# Patient Record
Sex: Female | Born: 1970 | Race: White | Hispanic: No | Marital: Married | Smoking: Never smoker
Health system: Southern US, Community
[De-identification: ages and names within clinical notes are randomized; demographics above are authoritative.]

## PROBLEM LIST (undated history)

## (undated) DIAGNOSIS — E785 Hyperlipidemia, unspecified: Secondary | ICD-10-CM

## (undated) HISTORY — DX: Hyperlipidemia, unspecified: E78.5

## (undated) HISTORY — PX: WISDOM TOOTH EXTRACTION: SHX21

---

## 1998-03-16 ENCOUNTER — Other Ambulatory Visit: Admission: RE | Admit: 1998-03-16 | Discharge: 1998-03-16 | Payer: Self-pay | Admitting: Gynecology

## 1999-03-21 ENCOUNTER — Other Ambulatory Visit: Admission: RE | Admit: 1999-03-21 | Discharge: 1999-03-21 | Payer: Self-pay | Admitting: Gynecology

## 2000-03-27 ENCOUNTER — Other Ambulatory Visit: Admission: RE | Admit: 2000-03-27 | Discharge: 2000-03-27 | Payer: Self-pay | Admitting: Gynecology

## 2000-09-14 ENCOUNTER — Ambulatory Visit (HOSPITAL_COMMUNITY): Admission: RE | Admit: 2000-09-14 | Discharge: 2000-09-14 | Payer: Self-pay | Admitting: Obstetrics and Gynecology

## 2000-09-14 ENCOUNTER — Encounter: Payer: Self-pay | Admitting: Obstetrics and Gynecology

## 2000-10-12 ENCOUNTER — Encounter: Payer: Self-pay | Admitting: Obstetrics and Gynecology

## 2000-10-12 ENCOUNTER — Ambulatory Visit (HOSPITAL_COMMUNITY): Admission: RE | Admit: 2000-10-12 | Discharge: 2000-10-12 | Payer: Self-pay | Admitting: Obstetrics and Gynecology

## 2000-11-09 ENCOUNTER — Encounter: Payer: Self-pay | Admitting: Obstetrics and Gynecology

## 2000-11-09 ENCOUNTER — Ambulatory Visit (HOSPITAL_COMMUNITY): Admission: RE | Admit: 2000-11-09 | Discharge: 2000-11-09 | Payer: Self-pay | Admitting: Obstetrics and Gynecology

## 2000-12-07 ENCOUNTER — Encounter: Payer: Self-pay | Admitting: Obstetrics and Gynecology

## 2000-12-07 ENCOUNTER — Ambulatory Visit (HOSPITAL_COMMUNITY): Admission: RE | Admit: 2000-12-07 | Discharge: 2000-12-07 | Payer: Self-pay | Admitting: Obstetrics and Gynecology

## 2001-01-04 ENCOUNTER — Encounter: Payer: Self-pay | Admitting: Obstetrics and Gynecology

## 2001-01-04 ENCOUNTER — Ambulatory Visit (HOSPITAL_COMMUNITY): Admission: RE | Admit: 2001-01-04 | Discharge: 2001-01-04 | Payer: Self-pay | Admitting: Obstetrics and Gynecology

## 2001-01-24 ENCOUNTER — Inpatient Hospital Stay (HOSPITAL_COMMUNITY): Admission: AD | Admit: 2001-01-24 | Discharge: 2001-01-27 | Payer: Self-pay | Admitting: Obstetrics and Gynecology

## 2001-01-28 ENCOUNTER — Encounter: Admission: RE | Admit: 2001-01-28 | Discharge: 2001-02-27 | Payer: Self-pay | Admitting: Obstetrics and Gynecology

## 2001-02-28 ENCOUNTER — Encounter: Admission: RE | Admit: 2001-02-28 | Discharge: 2001-03-30 | Payer: Self-pay | Admitting: Obstetrics and Gynecology

## 2001-03-01 ENCOUNTER — Other Ambulatory Visit: Admission: RE | Admit: 2001-03-01 | Discharge: 2001-03-01 | Payer: Self-pay | Admitting: Obstetrics and Gynecology

## 2002-03-21 ENCOUNTER — Other Ambulatory Visit: Admission: RE | Admit: 2002-03-21 | Discharge: 2002-03-21 | Payer: Self-pay | Admitting: Obstetrics and Gynecology

## 2003-03-30 ENCOUNTER — Ambulatory Visit (HOSPITAL_COMMUNITY): Admission: RE | Admit: 2003-03-30 | Discharge: 2003-03-30 | Payer: Self-pay | Admitting: Orthopedic Surgery

## 2004-02-22 ENCOUNTER — Inpatient Hospital Stay (HOSPITAL_COMMUNITY): Admission: AD | Admit: 2004-02-22 | Discharge: 2004-02-24 | Payer: Self-pay | Admitting: Obstetrics and Gynecology

## 2004-02-25 ENCOUNTER — Encounter: Admission: RE | Admit: 2004-02-25 | Discharge: 2004-03-26 | Payer: Self-pay | Admitting: Obstetrics and Gynecology

## 2004-04-04 ENCOUNTER — Other Ambulatory Visit: Admission: RE | Admit: 2004-04-04 | Discharge: 2004-04-04 | Payer: Self-pay | Admitting: Obstetrics and Gynecology

## 2004-04-24 ENCOUNTER — Encounter: Admission: RE | Admit: 2004-04-24 | Discharge: 2004-05-24 | Payer: Self-pay | Admitting: Obstetrics and Gynecology

## 2006-09-27 ENCOUNTER — Ambulatory Visit (HOSPITAL_COMMUNITY): Admission: RE | Admit: 2006-09-27 | Discharge: 2006-09-27 | Payer: Self-pay | Admitting: Obstetrics and Gynecology

## 2006-10-24 ENCOUNTER — Ambulatory Visit (HOSPITAL_COMMUNITY): Admission: RE | Admit: 2006-10-24 | Discharge: 2006-10-24 | Payer: Self-pay | Admitting: Obstetrics and Gynecology

## 2007-03-24 ENCOUNTER — Inpatient Hospital Stay (HOSPITAL_COMMUNITY): Admission: AD | Admit: 2007-03-24 | Discharge: 2007-03-26 | Payer: Self-pay | Admitting: Obstetrics and Gynecology

## 2007-03-28 ENCOUNTER — Encounter: Admission: RE | Admit: 2007-03-28 | Discharge: 2007-04-27 | Payer: Self-pay | Admitting: Obstetrics and Gynecology

## 2007-04-28 ENCOUNTER — Encounter: Admission: RE | Admit: 2007-04-28 | Discharge: 2007-05-21 | Payer: Self-pay | Admitting: Obstetrics and Gynecology

## 2008-01-19 IMAGING — US US OB NUCHAL TRANSLUCENCY 1ST GEST
1 series · 14 of 28 positions shown · non-contrast
Comparison: none

OBSTETRICAL ULTRASOUND:
 This ultrasound was performed in The [HOSPITAL], and the [facility] report will be stored to [REDACTED] PACS.

[Series 1: us ob nuchal translucency 1st gest · 14 of 33 slices shown]
[im 2/33]
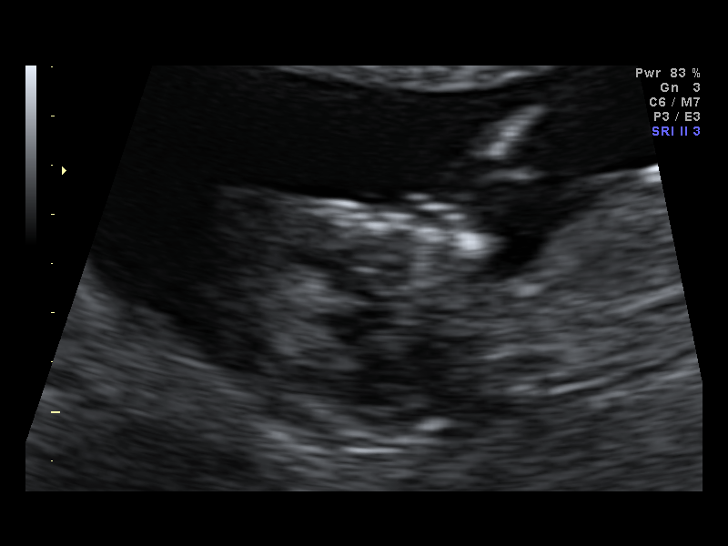
[im 4/33]
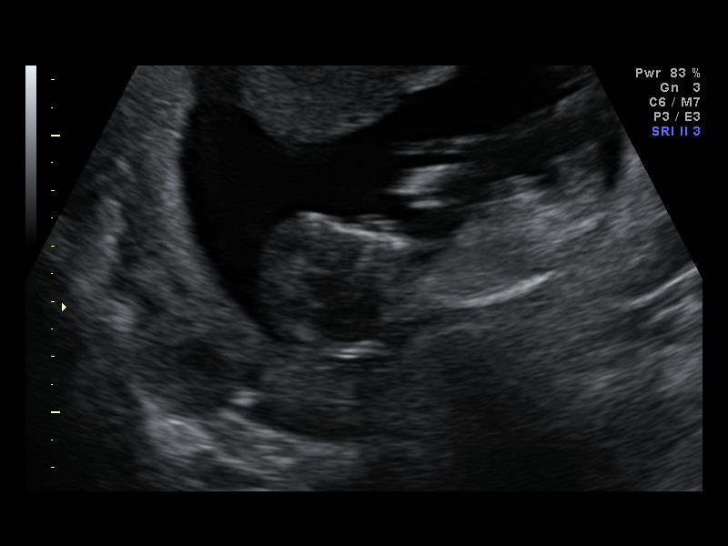
[im 6/33]
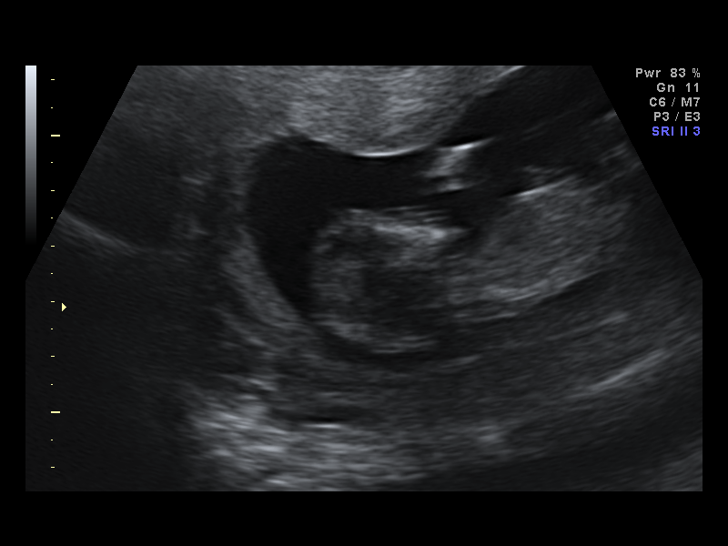
[im 9/33]
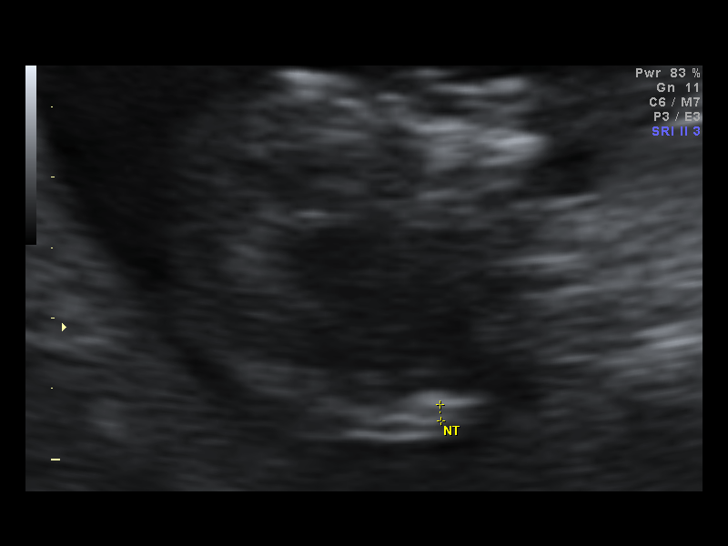
[im 11/33]
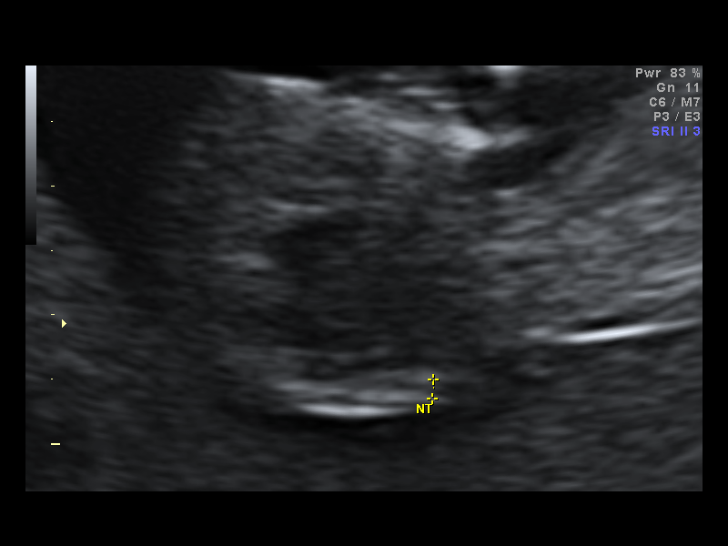
[im 14/33]
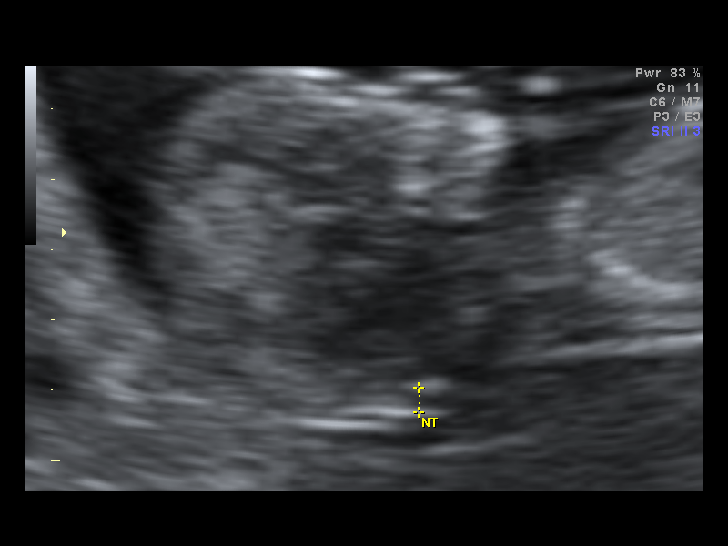
[im 16/33]
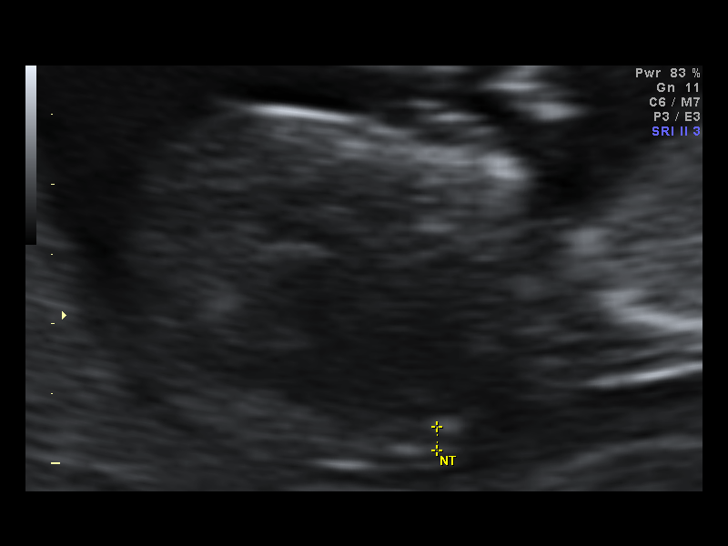
[im 18/33]
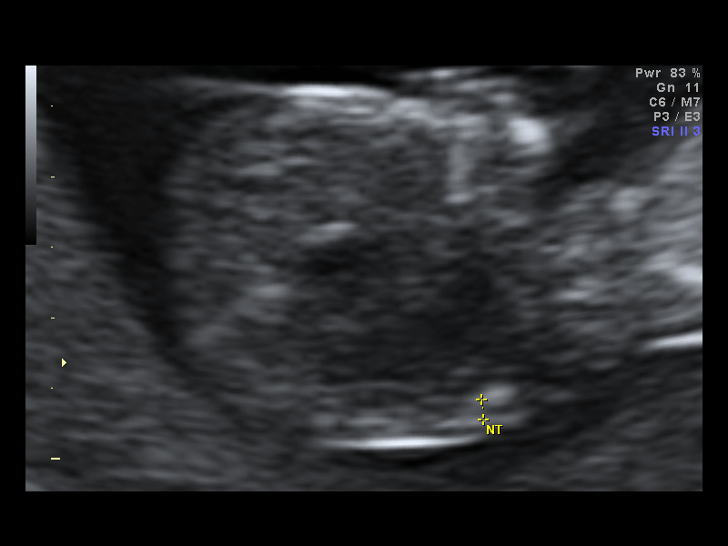
[im 21/33]
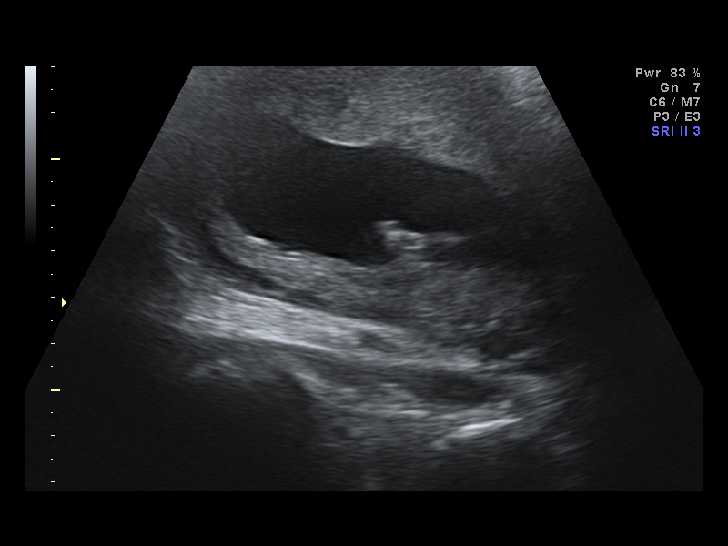
[im 23/33]
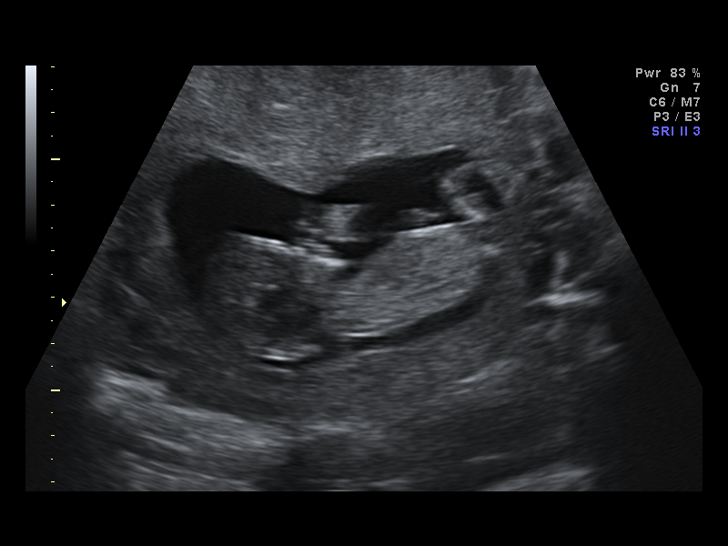
[im 25/33]
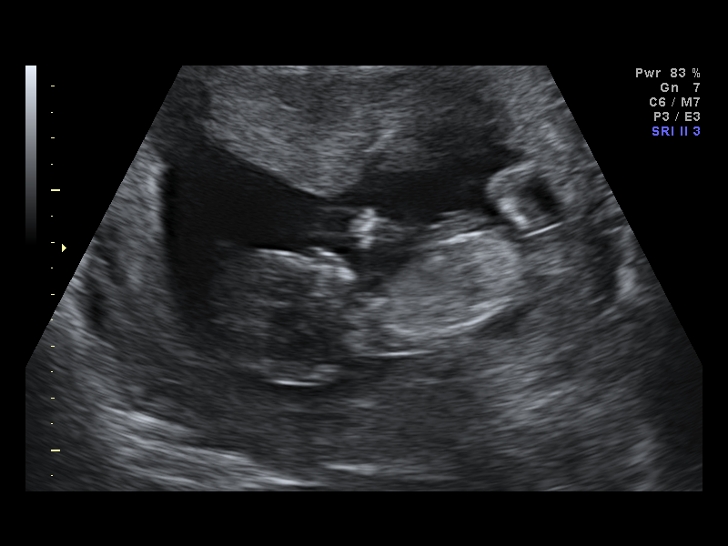
[im 28/33]
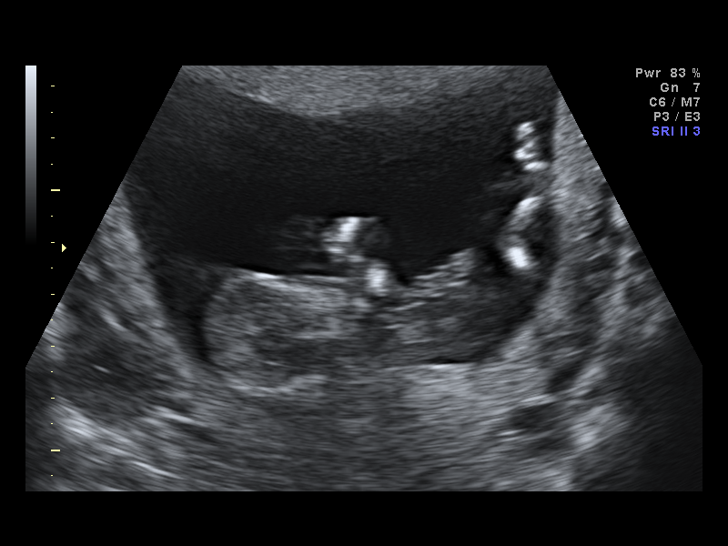
[im 30/33]
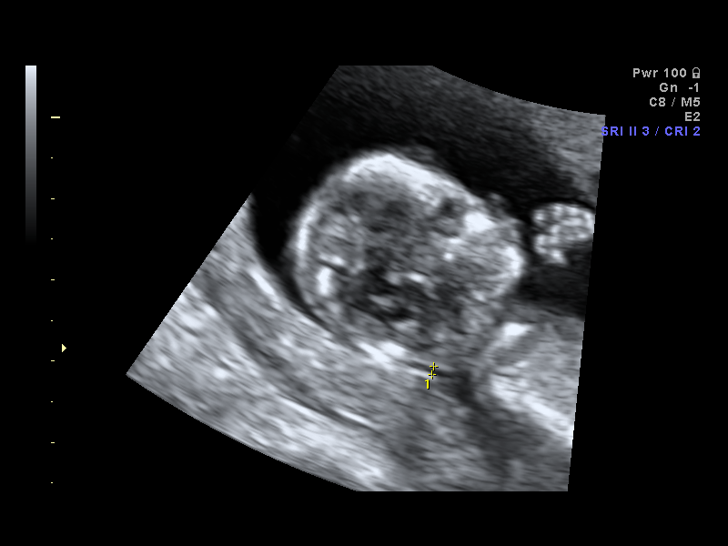
[im 33/33]
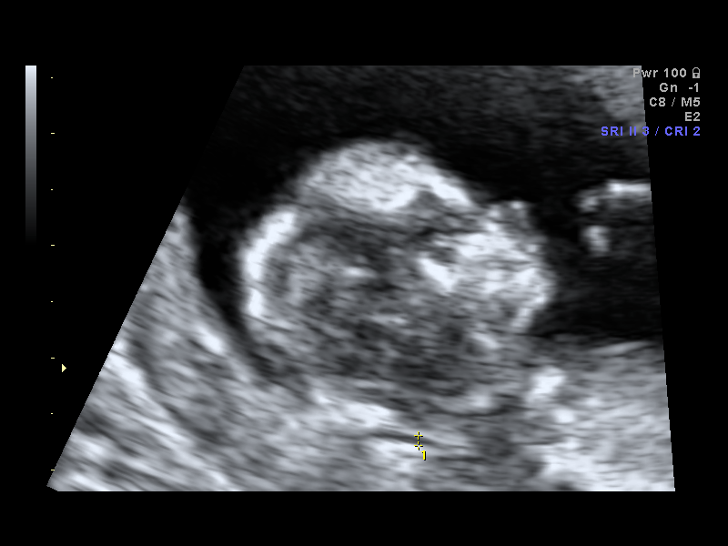

[14 of 28 positions shown; findings below may reference images not displayed]

IMPRESSION: The [facility] report has also been faxed to the ordering physician.

## 2010-05-24 NOTE — Discharge Summary (Signed)
Alexandra Flynn, Alexandra Flynn           ACCOUNT NO.:  [id number]   MEDICAL RECORD NO.:  [id number]          PATIENT TYPE:  INP   LOCATION:  9125                          FACILITY:  [facility]   PHYSICIAN:  Huel Cote, M.D. DATE OF BIRTH:  [birth year]   DATE OF ADMISSION:  03/24/2007  DATE OF DISCHARGE:  03/26/2007                               DISCHARGE SUMMARY   DISCHARGE DIAGNOSES:  1. Term pregnancy at 38 weeks delivery.  2. Status post precipitous vaginal  delivery.   DISCHARGE MEDICATIONS:  1. Motrin 600 mg p.o.q.6 h.  2. Percocet 1-2 tablet p.o. q.4 h. p.r.n.   DISCHARGE FOLLOWUP:  The patient is to followup in the office in 6 weeks  for her routine postpartum exam.   HOSPITAL COURSE:  The patient is a 40 year old G3, P 2-0-0-2 who was  admitted at [redacted] weeks gestation with complaint of ruptured membranes.  She was placed on Pitocin and augmented due to slow contractions.  Her  prenatal care was complicated only by advanced maternal age of normal  integrated screen.   Prenatal labs were as follows, A+ positive, antibody negative, RPR  nonreactive, rubella immune, hepatitis B surface antigen negative, HIV  negative, GC and Chlamydia negative,  group B strep negative, 1-hour  glucola 79, first trimester screen normal.   PAST OB HISTORY:  She had had 2 vaginal deliveries prior in 2003 and  2006.  She had no significant gynecological, surgical, or medical  history.   On admission, she was afebrile with stable vital signs.  Fetal heart  rate was reactive.  Cervix was 74 and -1 station.  She did get  uncomfortable and received her epidural within 10 minutes of checking  her and calling her 4 cm.  I was called back that the patient was ready  to push.  She progressed precipitously over 15 minutes from 4 cm to  complete and pushed well with vigorous female infant delivered ;  however, a small first degree laceration.  Weight was 6 pounds 4 ounces.  Apgars were 8 and 9.  First degree  laceration was repaired with 2-0  Vicryl for hemostasis under a local block.  Placenta was retained after  25 minutes, though she was given Versed 2 mg and fentanyl 2 mg, and the  placenta was delivered by the vigorous fundal massage.  Estimated blood  loss was 350 mL.  The cervix and the rectum were intact.  By postpartum  day #2, she was doing well.  Discharged hemoglobin was 11.1.  She had no  significant complaints and was discharged home with her prescriptions as  previously stated.  She was given a prescription of Diflucan 200 mg  daily for 10 days for possible candida, as she had a chronic problem  with her prior pregnancy and breast feeding.     Huel Cote, M.D.  Electronically Signed    KR/MEDQ  D:  04/16/2007  T:  04/17/2007  Job:  [id number]

## 2010-05-27 NOTE — Discharge Summary (Signed)
[facility]  Patient:    Alexandra Flynn, Alexandra Flynn Visit Number: [id number] MRN: [id number]          Service Type: OBS Location: 910A 9120 01 Attending Physician:  Malon Kindle Dictated by:   Zenaida Niece, M.D. Admit Date:  01/24/2001 Discharge Date: 01/27/2001                             Discharge Summary  ADMISSION DIAGNOSES:          1. Intrauterine pregnancy at 37 weeks.                               2. Premature rupture of membranes.                               3. Group B strep carrier.                               4. Fetal multicystic dysplastic kidney.  COMPLICATIONS:                None.  CONSULTATIONS:                None.  HISTORY OF PRESENT ILLNESS:   This is a 40 year old white female, gravida 1, para 0, with an EGA of [redacted] weeks by an LMP consistent with an 8-week ultrasound with a due date of February 13, 2001, who presents with the complaint of leaking fluid since approximately 1900 on January 24, 2001, and when she presented to the hospital for evaluation this was confirmed.  Prenatal care complicated by a right fetal multicystic dysplastic kidney which has remained stable on ultrasound, with normal AFIs.  There have been no other significant findings.  PRENATAL LABORATORY DATA:     Blood type A positive, with a negative antibody screen.  RPR nonreactive.  Rubella immune.  Hepatitis B surface antigen negative.  HIV negative.  Gonorrhea and chlamydia negative.  Triple screen normal.  Group B strep positive.  One-hour Glucola is not on the chart.  PAST MEDICAL HISTORY:         Noncontributory.  PHYSICAL EXAMINATION:  VITAL SIGNS:                  Afebrile, with stable vital signs.  ABDOMEN:                      Soft, with a fundal height of 38.5 cm on January 21, 2001.  Fetal heart tracing is normal, with good acceleration and no deceleration.  PELVIC:                       Cervix is fingertip, 50, -2, with  vertex presentation per the nurse in triage.  HOSPITAL COURSE:              The patient was admitted and started on penicillin for group B strep and allowed to see if she would labor on her own. After several hours of not contracting, the decision was made to begin Pitocin.  This was done at approximately 0515 on January 25, 2001.  The patient progressed in active labor and received an epidural.  She then progressed to complete and pushed well.  On the afternoon of January 25, 2001, she had the vaginal delivery of a viable female infant with Apgars of 7 and 9 who weighed 6 pounds 2 ounces, and the baby delivered through a shoulder cord in the ROA position.  Placenta was spontaneous and intact.  She had a first-degree laceration repaired with 3-0 Vicryl, and estimated blood loss was less than 500 cc.  Postpartum she did very well, remained afebrile, and breast-fed her baby without complications.  On the morning of postpartum day #2 she was felt to be stable enough for discharge home.  At the time of discharge the parents were awaiting news from Dr. Maple Hudson as far as ultrasound studies on the babys kidney to see what, if anything, had to be done.  CONDITION ON DISCHARGE:       Stable.  DISPOSITION:                  Discharged to home.  DIET:                         Regular.  ACTIVITY:                     Pelvic rest.  FOLLOW-UP:                    In four to six weeks.  MEDICATIONS:                  Over-the-counter Motrin p.r.n.  DISCHARGE INSTRUCTIONS:       She was given our discharge pamphlet. Dictated by:   Zenaida Niece, M.D. Attending Physician:  Malon Kindle DD:  01/27/01 TD:  01/28/01 Job: [id number]

## 2010-05-27 NOTE — Discharge Summary (Signed)
NAME:  Alexandra Flynn, Alexandra Flynn           ACCOUNT NO.:  [id number]   MEDICAL RECORD NO.:  [id number]          PATIENT TYPE:  INP   LOCATION:  9121                          FACILITY:  [facility]   PHYSICIAN:  Zenaida Niece, M.D.DATE OF BIRTH:  04-29-1970   DATE OF ADMISSION:  02/22/2004  DATE OF DISCHARGE:  02/24/2004                                 DISCHARGE SUMMARY   ADMISSION DIAGNOSIS:  Intrauterine pregnancy at 39 weeks.   DISCHARGE DIAGNOSIS:  Intrauterine pregnancy at 39 weeks.   PROCEDURE:  On February 22, 2004, she had a spontaneous vaginal delivery.   HISTORY AND PHYSICAL:  This is a 40 year old white female gravida 2, para 1-  0-0-1 with EGA of [redacted] weeks, who presents for elective induction.  Prenatal  care was uncomplicated.   PRENATAL LABORATORIES:  Blood type is A positive with a negative antibody  screen.  RPR nonreactive.  Rubella immune.  Hepatitis B surface antigen  negative.  Gonorrhea and Chlamydia negative.  Triple screen normal.  One  hour Glucola 113.  Group B Streptococcus was negative.   PAST OBSTETRICAL HISTORY:  In 2003 a vaginal delivery at 37 weeks of 6  pounds 2 ounces.  The baby had a multicystic dysplastic kidney.   PHYSICAL EXAMINATION:  VITAL SIGNS:  She is afebrile with stable vital  signs.  FETAL HEART TRACING:  Reactive with occasional contractions.  ABDOMEN:  Gravid, nontender with an estimated fetal weight of 7 pounds.  PELVIC:  The cervix is 3, 50, -1 to -2, vertex presentation, and amniotomy  revealed clear fluid.   HOSPITAL COURSE:  The patient was admitted and had an amniotomy performed  for induction.  She then required Pitocin to enter active labor.  She  received an epidural.  Throughout the day she progressed to complete and  then pushed well.  On the evening of February 22, 2004 she had a vaginal  delivery of a viable female infant with Apgars of 8 and 9, who weighed 7  pounds 5 ounces.  The placenta delivered spontaneous and was intact.   She  had a second degree laceration that was repaired with 3-0 Vicryl and  estimated blood loss was less than 500 cc.  Post partum she had no  significant complications.  On post partum day #2 she was stable for  discharge home.   DISCHARGE INSTRUCTIONS:  Regular diet.  Pelvic rest.  Follow up in six  weeks.  She is given her discharge pamphlet.   MEDICATIONS:  Over-the-counter ibuprofen as needed.    TDM/MEDQ  D:  02/24/2004  T:  02/24/2004  Job:  [id number]

## 2010-10-03 LAB — CBC
HCT: 32.3 — ABNORMAL LOW
HCT: 36
Hemoglobin: 11.1 — ABNORMAL LOW
MCHC: 34.4
MCHC: 35.1
MCV: 90
Platelets: 225
Platelets: 270
RBC: 4
RDW: 13.5
WBC: 12.6 — ABNORMAL HIGH

## 2012-05-01 ENCOUNTER — Other Ambulatory Visit (HOSPITAL_COMMUNITY): Payer: Self-pay | Admitting: Obstetrics and Gynecology

## 2012-05-01 DIAGNOSIS — Z1231 Encounter for screening mammogram for malignant neoplasm of breast: Secondary | ICD-10-CM

## 2012-05-16 ENCOUNTER — Ambulatory Visit (HOSPITAL_COMMUNITY)
Admission: RE | Admit: 2012-05-16 | Discharge: 2012-05-16 | Disposition: A | Discharge status: Home / Self Care | Payer: 59 | Source: Ambulatory Visit | Attending: Obstetrics and Gynecology | Admitting: Obstetrics and Gynecology

## 2012-05-16 DIAGNOSIS — Z1231 Encounter for screening mammogram for malignant neoplasm of breast: Secondary | ICD-10-CM | POA: Insufficient documentation

## 2013-08-28 ENCOUNTER — Other Ambulatory Visit (HOSPITAL_COMMUNITY): Payer: Self-pay | Admitting: Obstetrics and Gynecology

## 2013-08-28 DIAGNOSIS — Z1231 Encounter for screening mammogram for malignant neoplasm of breast: Secondary | ICD-10-CM

## 2013-09-03 ENCOUNTER — Ambulatory Visit (HOSPITAL_COMMUNITY): Payer: 59

## 2013-09-18 ENCOUNTER — Ambulatory Visit (HOSPITAL_COMMUNITY)
Admission: RE | Admit: 2013-09-18 | Discharge: 2013-09-18 | Disposition: A | Discharge status: Home / Self Care | Payer: 59 | Source: Ambulatory Visit | Attending: Obstetrics and Gynecology | Admitting: Obstetrics and Gynecology

## 2013-09-18 DIAGNOSIS — Z1231 Encounter for screening mammogram for malignant neoplasm of breast: Secondary | ICD-10-CM | POA: Insufficient documentation

## 2014-05-01 ENCOUNTER — Encounter: Payer: Self-pay | Admitting: *Deleted

## 2014-05-01 ENCOUNTER — Emergency Department
Admission: EM | Admit: 2014-05-01 | Discharge: 2014-05-01 | Disposition: A | Discharge status: Home / Self Care | Payer: 59 | Source: Home / Self Care | Attending: Emergency Medicine | Admitting: Emergency Medicine

## 2014-05-01 DIAGNOSIS — H918X9 Other specified hearing loss, unspecified ear: Secondary | ICD-10-CM

## 2014-05-01 DIAGNOSIS — H6123 Impacted cerumen, bilateral: Secondary | ICD-10-CM

## 2014-05-01 DIAGNOSIS — H6120 Impacted cerumen, unspecified ear: Secondary | ICD-10-CM | POA: Diagnosis not present

## 2014-05-01 NOTE — ED Notes (Signed)
Pt c/o RT ear cerumen impaction x 1 mth.

## 2014-05-01 NOTE — ED Provider Notes (Signed)
CSN: [id number]     Arrival date & time 05/01/14  1032 History   First MD Initiated Contact with Patient 05/01/14 1039     Chief Complaint  Patient presents with  . Cerumen Impaction    HPI Complains of one month of both ears impacted with wax, right worse than left. Causing some decreased hearing. Both ears have mild discomfort. No other ENT symptoms. No fever or chills or nausea or vomiting. No cardiorespiratory symptoms. Recalls no trauma. She denies using Q-tips. History reviewed. No pertinent past medical history. History reviewed. No pertinent past surgical history. Family History  Problem Relation Age of Onset  . Diabetes Mother   . Hypertension Mother   . Hyperlipidemia Mother   . Hyperlipidemia Father   . Hypertension Father    History  Substance Use Topics  . Smoking status: Never Smoker   . Smokeless tobacco: Not on file  . Alcohol Use: Yes   OB History    No data available     Review of Systems Remainder of Review of Systems negative for acute change except as noted in the HPI.  Allergies  Review of patient's allergies indicates no known allergies.  Home Medications   Prior to Admission medications   Not on File   BP 106/74 mmHg  Pulse 78  Temp(Src) 98.5 F (36.9 C) (Oral)  Resp 16  Ht 5\' 3"  (1.6 m)  Wt 146 lb (66.225 kg)  BMI 25.87 kg/m2  SpO2 98%  LMP 04/12/2014 Physical Exam  Constitutional: She is oriented to person, place, and time. She appears well-developed and well-nourished. No distress.  HENT:  Head: Normocephalic and atraumatic.  External ears: Nontender. There are bilateral dense cerumen impactions, right greater than left. Mildly decreased hearing bilaterally to whisper.  Eyes: Conjunctivae and EOM are normal. Pupils are equal, round, and reactive to light. No scleral icterus.  Neck: Normal range of motion.  Cardiovascular: Normal rate.   Pulmonary/Chest: Effort normal.  Abdominal: She exhibits no distension.  Musculoskeletal:  Normal range of motion.  Neurological: She is alert and oriented to person, place, and time.  Skin: Skin is warm.  Psychiatric: She has a normal mood and affect.  Nursing note and vitals reviewed.   ED Course  EAR CERUMEN REMOVAL Date/Time: 05/01/2014 11:16 AM Performed by: Georgina PillionMASSEY, DAVID Authorized by: Georgina PillionMASSEY, DAVID Consent: Verbal consent obtained. Risks and benefits: risks, benefits and alternatives were discussed Consent given by: patient Patient understanding: patient states understanding of the procedure being performed Patient identity confirmed: verbally with patient Local anesthetic: none Location details: left ear Procedure type: curette and irrigation Patient tolerance: Patient tolerated the procedure well with no immediate complications Comments: Successful removal of bilateral cerumen impactions.--Hearing rechecked afterward, and her hearing improved and was normal bilaterally.   (including critical care time) Labs Review Labs Reviewed - No data to display  Imaging Review No results found.   MDM   1. Bilateral hearing loss due to cerumen impaction    Bilateral cerumen removal procedure, see details above. She states that her ears feel all better and she was grateful that she could hear better and the ear pressure and pain was completely resolved Then reexamined the ear canals which were clear. No sign of infection. Nontender. Both TMs normal and intact. Other advice given regarding prevention or treatment of cerumen impactions. Follow-up prn    Lajean Manesavid Massey, MD 05/01/14 1119

## 2014-08-28 ENCOUNTER — Other Ambulatory Visit (HOSPITAL_COMMUNITY): Payer: Self-pay | Admitting: Obstetrics and Gynecology

## 2014-08-28 DIAGNOSIS — Z1231 Encounter for screening mammogram for malignant neoplasm of breast: Secondary | ICD-10-CM

## 2014-09-22 ENCOUNTER — Ambulatory Visit (HOSPITAL_COMMUNITY)
Admission: RE | Admit: 2014-09-22 | Discharge: 2014-09-22 | Disposition: A | Discharge status: Home / Self Care | Payer: 59 | Source: Ambulatory Visit | Attending: Obstetrics and Gynecology | Admitting: Obstetrics and Gynecology

## 2014-09-22 DIAGNOSIS — Z1231 Encounter for screening mammogram for malignant neoplasm of breast: Secondary | ICD-10-CM | POA: Insufficient documentation

## 2015-07-29 DIAGNOSIS — M722 Plantar fascial fibromatosis: Secondary | ICD-10-CM | POA: Diagnosis not present

## 2015-07-29 DIAGNOSIS — M79672 Pain in left foot: Secondary | ICD-10-CM | POA: Diagnosis not present

## 2015-07-29 DIAGNOSIS — M7732 Calcaneal spur, left foot: Secondary | ICD-10-CM | POA: Diagnosis not present

## 2015-07-29 DIAGNOSIS — M71572 Other bursitis, not elsewhere classified, left ankle and foot: Secondary | ICD-10-CM | POA: Diagnosis not present

## 2015-08-12 DIAGNOSIS — M71572 Other bursitis, not elsewhere classified, left ankle and foot: Secondary | ICD-10-CM | POA: Diagnosis not present

## 2015-08-12 DIAGNOSIS — M722 Plantar fascial fibromatosis: Secondary | ICD-10-CM | POA: Diagnosis not present

## 2015-09-30 DIAGNOSIS — Z1231 Encounter for screening mammogram for malignant neoplasm of breast: Secondary | ICD-10-CM | POA: Diagnosis not present

## 2015-10-01 DIAGNOSIS — H5211 Myopia, right eye: Secondary | ICD-10-CM | POA: Diagnosis not present

## 2015-11-04 DIAGNOSIS — Z01419 Encounter for gynecological examination (general) (routine) without abnormal findings: Secondary | ICD-10-CM | POA: Diagnosis not present

## 2015-11-04 DIAGNOSIS — Z6826 Body mass index (BMI) 26.0-26.9, adult: Secondary | ICD-10-CM | POA: Diagnosis not present

## 2015-11-04 DIAGNOSIS — Z1151 Encounter for screening for human papillomavirus (HPV): Secondary | ICD-10-CM | POA: Diagnosis not present

## 2015-11-04 DIAGNOSIS — Z1389 Encounter for screening for other disorder: Secondary | ICD-10-CM | POA: Diagnosis not present

## 2015-11-04 DIAGNOSIS — Z130 Encounter for screening for diseases of the blood and blood-forming organs and certain disorders involving the immune mechanism: Secondary | ICD-10-CM | POA: Diagnosis not present

## 2015-11-04 DIAGNOSIS — R3915 Urgency of urination: Secondary | ICD-10-CM | POA: Diagnosis not present

## 2015-11-04 DIAGNOSIS — Z124 Encounter for screening for malignant neoplasm of cervix: Secondary | ICD-10-CM | POA: Diagnosis not present

## 2015-12-30 DIAGNOSIS — R87618 Other abnormal cytological findings on specimens from cervix uteri: Secondary | ICD-10-CM | POA: Diagnosis not present

## 2015-12-30 DIAGNOSIS — N870 Mild cervical dysplasia: Secondary | ICD-10-CM | POA: Diagnosis not present

## 2016-09-21 DIAGNOSIS — H5211 Myopia, right eye: Secondary | ICD-10-CM | POA: Diagnosis not present

## 2016-10-26 DIAGNOSIS — B078 Other viral warts: Secondary | ICD-10-CM | POA: Diagnosis not present

## 2016-10-26 DIAGNOSIS — Z23 Encounter for immunization: Secondary | ICD-10-CM | POA: Diagnosis not present

## 2016-11-16 DIAGNOSIS — Z87410 Personal history of cervical dysplasia: Secondary | ICD-10-CM | POA: Diagnosis not present

## 2016-11-16 DIAGNOSIS — Z130 Encounter for screening for diseases of the blood and blood-forming organs and certain disorders involving the immune mechanism: Secondary | ICD-10-CM | POA: Diagnosis not present

## 2016-11-16 DIAGNOSIS — Z1231 Encounter for screening mammogram for malignant neoplasm of breast: Secondary | ICD-10-CM | POA: Diagnosis not present

## 2016-11-16 DIAGNOSIS — Z01419 Encounter for gynecological examination (general) (routine) without abnormal findings: Secondary | ICD-10-CM | POA: Diagnosis not present

## 2016-11-16 DIAGNOSIS — N898 Other specified noninflammatory disorders of vagina: Secondary | ICD-10-CM | POA: Diagnosis not present

## 2016-11-16 DIAGNOSIS — K5909 Other constipation: Secondary | ICD-10-CM | POA: Diagnosis not present

## 2016-11-16 DIAGNOSIS — Z1389 Encounter for screening for other disorder: Secondary | ICD-10-CM | POA: Diagnosis not present

## 2016-11-16 DIAGNOSIS — R635 Abnormal weight gain: Secondary | ICD-10-CM | POA: Diagnosis not present

## 2016-11-16 DIAGNOSIS — Z1151 Encounter for screening for human papillomavirus (HPV): Secondary | ICD-10-CM | POA: Diagnosis not present

## 2016-12-07 DIAGNOSIS — B078 Other viral warts: Secondary | ICD-10-CM | POA: Diagnosis not present

## 2017-01-23 DIAGNOSIS — S63591A Other specified sprain of right wrist, initial encounter: Secondary | ICD-10-CM | POA: Diagnosis not present

## 2017-10-26 DIAGNOSIS — H5213 Myopia, bilateral: Secondary | ICD-10-CM | POA: Diagnosis not present

## 2017-10-26 DIAGNOSIS — H52223 Regular astigmatism, bilateral: Secondary | ICD-10-CM | POA: Diagnosis not present

## 2017-11-22 DIAGNOSIS — Z130 Encounter for screening for diseases of the blood and blood-forming organs and certain disorders involving the immune mechanism: Secondary | ICD-10-CM | POA: Diagnosis not present

## 2017-11-22 DIAGNOSIS — N898 Other specified noninflammatory disorders of vagina: Secondary | ICD-10-CM | POA: Diagnosis not present

## 2017-11-22 DIAGNOSIS — Z87410 Personal history of cervical dysplasia: Secondary | ICD-10-CM | POA: Diagnosis not present

## 2017-11-22 DIAGNOSIS — Z01419 Encounter for gynecological examination (general) (routine) without abnormal findings: Secondary | ICD-10-CM | POA: Diagnosis not present

## 2017-11-22 DIAGNOSIS — R3915 Urgency of urination: Secondary | ICD-10-CM | POA: Diagnosis not present

## 2017-11-22 DIAGNOSIS — Z1231 Encounter for screening mammogram for malignant neoplasm of breast: Secondary | ICD-10-CM | POA: Diagnosis not present

## 2017-11-22 DIAGNOSIS — Z1389 Encounter for screening for other disorder: Secondary | ICD-10-CM | POA: Diagnosis not present

## 2017-11-22 DIAGNOSIS — Z1151 Encounter for screening for human papillomavirus (HPV): Secondary | ICD-10-CM | POA: Diagnosis not present

## 2017-11-22 DIAGNOSIS — Z6828 Body mass index (BMI) 28.0-28.9, adult: Secondary | ICD-10-CM | POA: Diagnosis not present

## 2018-01-04 ENCOUNTER — Encounter: Payer: Self-pay | Admitting: Family Medicine

## 2018-01-04 ENCOUNTER — Ambulatory Visit (INDEPENDENT_AMBULATORY_CARE_PROVIDER_SITE_OTHER): Payer: Self-pay | Admitting: Family Medicine

## 2018-01-04 VITALS — BP 130/72 | HR 79 | Temp 98.5°F | Wt 162.8 lb

## 2018-01-04 DIAGNOSIS — M545 Low back pain, unspecified: Secondary | ICD-10-CM

## 2018-01-04 LAB — POCT URINALYSIS DIPSTICK
BILIRUBIN UA: NEGATIVE
Blood, UA: NEGATIVE
Glucose, UA: NEGATIVE
KETONES UA: NEGATIVE
Leukocytes, UA: NEGATIVE
Nitrite, UA: NEGATIVE
Protein, UA: NEGATIVE
SPEC GRAV UA: 1.01 (ref 1.010–1.025)
UROBILINOGEN UA: NEGATIVE U/dL — AB
pH, UA: 5 (ref 5.0–8.0)

## 2018-01-04 MED ORDER — CYCLOBENZAPRINE HCL 10 MG PO TABS: 10.0000 mg | ORAL_TABLET | Freq: Three times a day (TID) | ORAL | 0 refills | Status: DC | PRN

## 2018-01-04 MED ORDER — MELOXICAM 15 MG PO TABS: 15.0000 mg | ORAL_TABLET | Freq: Every day | ORAL | 0 refills | Status: AC

## 2018-01-04 NOTE — Progress Notes (Signed)
.  dmd

## 2018-01-04 NOTE — Patient Instructions (Signed)
Acute Back Pain, Adult  Acute back pain is sudden and usually short-lived. It is often caused by an injury to the muscles and tissues in the back. The injury may result from:   A muscle or ligament getting overstretched or torn (strained). Ligaments are tissues that connect bones to each other. Lifting something improperly can cause a back strain.   Wear and tear (degeneration) of the spinal disks. Spinal disks are circular tissue that provides cushioning between the bones of the spine (vertebrae).   Twisting motions, such as while playing sports or doing yard work.   A hit to the back.   Arthritis.  You may have a physical exam, lab tests, and imaging tests to find the cause of your pain. Acute back pain usually goes away with rest and home care.  Follow these instructions at home:  Managing pain, stiffness, and swelling   Take over-the-counter and prescription medicines only as told by your health care provider.   Your health care provider may recommend applying ice during the first 24-48 hours after your pain starts. To do this:  ? Put ice in a plastic bag.  ? Place a towel between your skin and the bag.  ? Leave the ice on for 20 minutes, 2-3 times a day.   If directed, apply heat to the affected area as often as told by your health care provider. Use the heat source that your health care provider recommends, such as a moist heat pack or a heating pad.  ? Place a towel between your skin and the heat source.  ? Leave the heat on for 20-30 minutes.  ? Remove the heat if your skin turns bright red. This is especially important if you are unable to feel pain, heat, or cold. You have a greater risk of getting burned.  Activity     Do not stay in bed. Staying in bed for more than 1-2 days can delay your recovery.   Sit up and stand up straight. Avoid leaning forward when you sit, or hunching over when you stand.  ? If you work at a desk, sit close to it so you do not need to lean over. Keep your chin tucked  in. Keep your neck drawn back, and keep your elbows bent at a right angle. Your arms should look like the letter "L."  ? Sit high and close to the steering wheel when you drive. Add lower back (lumbar) support to your car seat, if needed.   Take short walks on even surfaces as soon as you are able. Try to increase the length of time you walk each day.   Do not sit, drive, or stand in one place for more than 30 minutes at a time. Sitting or standing for long periods of time can put stress on your back.   Do not drive or use heavy machinery while taking prescription pain medicine.   Use proper lifting techniques. When you bend and lift, use positions that put less stress on your back:  ? Bend your knees.  ? Keep the load close to your body.  ? Avoid twisting.   Exercise regularly as told by your health care provider. Exercising helps your back heal faster and helps prevent back injuries by keeping muscles strong and flexible.   Work with a physical therapist to make a safe exercise program, as recommended by your health care provider. Do any exercises as told by your physical therapist.  Lifestyle   Maintain   a healthy weight. Extra weight puts stress on your back and makes it difficult to have good posture.   Avoid activities or situations that make you feel anxious or stressed. Stress and anxiety increase muscle tension and can make back pain worse. Learn ways to manage anxiety and stress, such as through exercise.  General instructions   Sleep on a firm mattress in a comfortable position. Try lying on your side with your knees slightly bent. If you lie on your back, put a pillow under your knees.   Follow your treatment plan as told by your health care provider. This may include:  ? Cognitive or behavioral therapy.  ? Acupuncture or massage therapy.  ? Meditation or yoga.  Contact a health care provider if:   You have pain that is not relieved with rest or medicine.   You have increasing pain going down  into your legs or buttocks.   Your pain does not improve after 2 weeks.   You have pain at night.   You lose weight without trying.   You have a fever or chills.  Get help right away if:   You develop new bowel or bladder control problems.   You have unusual weakness or numbness in your arms or legs.   You develop nausea or vomiting.   You develop abdominal pain.   You feel faint.  Summary   Acute back pain is sudden and usually short-lived.   Use proper lifting techniques. When you bend and lift, use positions that put less stress on your back.   Take over-the-counter and prescription medicines and apply heat or ice as directed by your health care provider.  This information is not intended to replace advice given to you by your health care provider. Make sure you discuss any questions you have with your health care provider.  Document Released: 12/26/2004 Document Revised: 08/02/2017 Document Reviewed: 08/09/2016  Elsevier Interactive Patient Education  2019 [employer]

## 2018-01-04 NOTE — Progress Notes (Signed)
Alexandra FlockJennifer L Piech  MRN: [id number] DOB: 05/03/1970  Subjective:  Alexandra Flynn is a 47 y.o. female seen in office today for a chief complaint of low back pain. The patient has had no prior back problems. Symptoms have been present for 1 month and are intermittent and wax and wane.  Onset was related to / precipitated by no known injury or historical trauma. The pain is located in the thoracic and does not radiate. The pain is described as spasm like and occurs intermittently.  Symptoms are exacerbated by nothing in particular. Symptoms are improved by acetaminophen and change in body position. Pt has also tried rest which provided no symptom relief. Pt has no other symptoms associated with the back pain. Patient denies trauma or any loss of sensation in the saddle area, loss of bowel or bladder control, fever, unexplained weight loss, IV drug use, prolonged use of steroids, immunosuppression.  Review of Systems  As per HPI  There are no active problems to display for this patient.   No current outpatient medications on file prior to visit.   No current facility-administered medications on file prior to visit.     No Known Allergies   Objective:  BP 130/72 (BP Location: Right Arm, Patient Position: Sitting)   Pulse 79   Temp 98.5 F (36.9 C) (Oral)   Wt 162 lb 12.8 oz (73.8 kg)   SpO2 100%   BMI 28.84 kg/m   Physical Exam Vitals signs reviewed.  Constitutional:      Appearance: She is well-developed. She is not toxic-appearing.  HENT:     Head: Normocephalic.     Right Ear: Hearing, tympanic membrane, ear canal and external ear normal.     Left Ear: Hearing, tympanic membrane, ear canal and external ear normal.     Nose: Nose normal.     Mouth/Throat:     Pharynx: Uvula midline.  Neck:     Musculoskeletal: Normal range of motion and neck supple.  Cardiovascular:     Rate and Rhythm: Normal rate and regular rhythm.     Pulses: Normal pulses.     Heart sounds: Normal  heart sounds.  Pulmonary:     Effort: Pulmonary effort is normal.     Breath sounds: Normal breath sounds.  Abdominal:     General: Bowel sounds are normal.     Palpations: Abdomen is soft.  Musculoskeletal: Normal range of motion.     Thoracic back: She exhibits tenderness, pain and spasm. She exhibits normal range of motion, no bony tenderness, no swelling, no edema, no deformity, no laceration and normal pulse.     Lumbar back: She exhibits tenderness, pain and spasm. She exhibits normal range of motion, no bony tenderness, no swelling, no edema, no deformity, no laceration and normal pulse.       Back:     Comments: Negative SLR, FABER's negative, normal no evidence of antalgic gait.  Lymphadenopathy:     Head:     Right side of head: No submental or submandibular adenopathy.     Left side of head: No submental or submandibular adenopathy.     Cervical: No cervical adenopathy.  Neurological:     Mental Status: She is alert and oriented to person, place, and time.     Cranial Nerves: Cranial nerves are intact.     Assessment and Plan :   1. Acute midline low back pain without sciatica  - POCT Urinalysis Dipstick Results for orders placed or performed  in visit on 01/04/18 (from the past 24 hour(s))  POCT Urinalysis Dipstick     Status: Abnormal   Collection Time: 01/04/18  5:25 PM  Result Value Ref Range   Color, UA yellow    Clarity, UA clear    Glucose, UA Negative Negative   Bilirubin, UA negative    Ketones, UA negative    Spec Grav, UA 1.010 1.010 - 1.025   Blood, UA negative    pH, UA 5.0 5.0 - 8.0   Protein, UA Negative Negative   Urobilinogen, UA negative (A) 0.2 or 1.0 E.U./dL   Nitrite, UA negative    Leukocytes, UA Negative Negative   Appearance cloudy    Odor no    - meloxicam (MOBIC) 15 MG tablet; Take 1 tablet (15 mg total) by mouth daily for 14 days.  Dispense: 14 tablet; Refill: 0 - cyclobenzaprine (FLEXERIL) 10 MG tablet; Take 1 tablet (10 mg total)  by mouth 3 (three) times daily as needed for muscle spasms (may cause drowsiness).  Dispense: 15 tablet; Refill: 0 -discussed non pharmacologic options for treatment ( heat, topical preparations, over the counter patches of patient's choice)  Hx and PE findings consistent with low back strain and spasm. Patient has great flexibility and ROM on exam. No acute findings on neuro exam, strength and sensation full and intact. No midline tenderness. Recommend conservative tx with rest, heating pad, NSAIDs, muscle relaxants, and stretching as tolerated. Given education material for low back stretches. Avoid heavy lifting until pain improves. Advised to return to clinic if symptoms worsen, do not improve, or as needed.

## 2018-08-07 MED FILL — PREVIDENT 5000 SENSITIVE PA: 1.1-5 | 30 days supply | Qty: 100 | Fill #0

## 2018-10-24 ENCOUNTER — Ambulatory Visit: Payer: 59 | Admitting: Family Medicine

## 2018-10-31 DIAGNOSIS — H5213 Myopia, bilateral: Secondary | ICD-10-CM | POA: Diagnosis not present

## 2018-10-31 DIAGNOSIS — H524 Presbyopia: Secondary | ICD-10-CM | POA: Diagnosis not present

## 2018-11-27 ENCOUNTER — Other Ambulatory Visit: Payer: Self-pay

## 2018-11-28 ENCOUNTER — Ambulatory Visit (INDEPENDENT_AMBULATORY_CARE_PROVIDER_SITE_OTHER): Payer: 59 | Admitting: Family Medicine

## 2018-11-28 ENCOUNTER — Encounter: Payer: Self-pay | Admitting: Family Medicine

## 2018-11-28 VITALS — BP 124/78 | HR 68 | Temp 98.2°F | Ht 63.0 in | Wt 166.2 lb

## 2018-11-28 DIAGNOSIS — M722 Plantar fascial fibromatosis: Secondary | ICD-10-CM | POA: Diagnosis not present

## 2018-11-28 DIAGNOSIS — E785 Hyperlipidemia, unspecified: Secondary | ICD-10-CM | POA: Diagnosis not present

## 2018-11-28 DIAGNOSIS — G4700 Insomnia, unspecified: Secondary | ICD-10-CM | POA: Diagnosis not present

## 2018-11-28 MED ORDER — TRAZODONE HCL 50 MG PO TABS: 25.0000 mg | ORAL_TABLET | Freq: Every evening | ORAL | 3 refills | Status: DC | PRN

## 2018-11-28 MED FILL — traZODone HCL 50 MG TABS: 50 | 30 days supply | Qty: 30 | Fill #0

## 2018-11-28 NOTE — Assessment & Plan Note (Signed)
Discussed treatment options and sleep hygiene measures.  Will start trazodone 50 mg nightly.  Discussed potential side effects.  Will follow with me in a few weeks via MyChart.

## 2018-11-28 NOTE — Assessment & Plan Note (Signed)
Discussed conservative management.  She will continue with home stretches and anti-inflammatories.  May need referral to orthopedics if continues to have bothersome symptoms.

## 2018-11-28 NOTE — Patient Instructions (Signed)
It was very nice to see you today!  Please start the trazodone.  Let me know if you have any side effects.  Please check in with me in 2 to 4 weeks via MyChart to let me know how you are doing.  Please let me know if your plantar fasciitis gets to the point where you would like to have surgery.  We can recheck your cholesterol next year.  I plan to see him back in a year or so for your annual physical, please come back see me sooner if needed.  Take care, Dr Jerline Pain  Please try these tips to maintain a healthy lifestyle:   Eat at least 3 REAL meals and 1-2 snacks per day.  Aim for no more than 5 hours between eating.  If you eat breakfast, please do so within one hour of getting up.    Obtain twice as many fruits/vegetables as protein or carbohydrate foods for both lunch and dinner. (Half of each meal should be fruits/vegetables, one quarter protein, and one quarter starchy carbs)   Cut down on sweet beverages. This includes juice, soda, and sweet tea.    Exercise at least 150 minutes every week.

## 2018-11-28 NOTE — Assessment & Plan Note (Signed)
Discussed lifestyle modifications including high-fiber diet and regular exercise.  Recheck lipid panel next year.

## 2018-11-28 NOTE — Progress Notes (Signed)
Chief Complaint:  Alexandra Flynn is a 48 y.o. female who presents today with a chief complaint of insomnia and to establish care.   Assessment/Plan:  Insomnia Discussed treatment options and sleep hygiene measures.  Will start trazodone 50 mg nightly.  Discussed potential side effects.  Will follow with me in a few weeks via MyChart.  Plantar fasciitis Discussed conservative management.  She will continue with home stretches and anti-inflammatories.  May need referral to orthopedics if continues to have bothersome symptoms.  Dyslipidemia Discussed lifestyle modifications including high-fiber diet and regular exercise.  Recheck lipid panel next year.    Subjective:  HPI:  Insomnia  Start about a year ago.  She is able to fall asleep normally however has difficulty staying asleep.  Usually goes to sleep around 9 30-10 however wakes up around 4:00 in the morning.  She has difficulty falling back asleep.  She has tried taking Benadryl with no significant improvement.  She has had more stress at home recently with COVID-19 and her childrens education.  Symptoms seem to be worsening.  No other treatments tried.  No other obvious aggravating or relieving factors.  Does not drink caffeine past early morning.  Sleeps in a dark, cool environment.  Does not use screens prior to bed.  Plantar Fascitis  Started several years ago.  Located in bilateral feet.  Has seen podiatry in the past.  Has tried several medication and injections with no significant improvement.  Symptoms are currently manageable.  Dyslipidemia Several year history.  Recently total cholesterol is in the 260 range.  She does not know her LDL or HDL levels.  She has been working on diet and exercise.  ROS: Per HPI, otherwise a complete review of systems was negative.   PMH:  The following were reviewed and entered/updated in epic: Past Medical History:  Diagnosis Date  . Hyperlipidemia    Patient Active Problem List    Diagnosis Date Noted  . Insomnia 11/28/2018  . Plantar fasciitis 11/28/2018  . Dyslipidemia 11/28/2018   History reviewed. No pertinent surgical history.  Family History  Problem Relation Age of Onset  . Diabetes Mother   . Hypertension Mother   . Hyperlipidemia Mother   . Rheum arthritis Mother   . Hyperlipidemia Father   . Hypertension Father     Medications- reviewed and updated Current Outpatient Medications  Medication Sig Dispense Refill  . cyclobenzaprine (FLEXERIL) 10 MG tablet Take 1 tablet (10 mg total) by mouth 3 (three) times daily as needed for muscle spasms (may cause drowsiness). (Patient not taking: Reported on 11/28/2018) 15 tablet 0  . traZODone (DESYREL) 50 MG tablet Take 0.5-1 tablets (25-50 mg total) by mouth at bedtime as needed for sleep. 30 tablet 3   No current facility-administered medications for this visit.     Allergies-reviewed and updated No Known Allergies  Social History   Socioeconomic History  . Marital status: Married    Spouse name: Not on file  . Number of children: Not on file  . Years of education: Not on file  . Highest education level: Not on file  Occupational History  . Not on file  Social Needs  . Financial resource strain: Not on file  . Food insecurity    Worry: Not on file    Inability: Not on file  . Transportation needs    Medical: Not on file    Non-medical: Not on file  Tobacco Use  . Smoking status: Never Smoker  .  Smokeless tobacco: Never Used  Substance and Sexual Activity  . Alcohol use: Yes    Alcohol/week: 1.0 standard drinks    Types: 1 Glasses of wine per week  . Drug use: No  . Sexual activity: Not on file  Lifestyle  . Physical activity    Days per week: Not on file    Minutes per session: Not on file  . Stress: Not on file  Relationships  . Social Musician on phone: Not on file    Gets together: Not on file    Attends religious service: Not on file    Active member of club or  organization: Not on file    Attends meetings of clubs or organizations: Not on file    Relationship status: Not on file  Other Topics Concern  . Not on file  Social History Narrative  . Not on file        Objective:  Physical Exam: BP 124/78   Pulse 68   Temp 98.2 F (36.8 C)   Ht 5\' 3"  (1.6 m)   Wt 166 lb 4 oz (75.4 kg)   SpO2 98%   BMI 29.45 kg/m   Gen: NAD, resting comfortably CV: Regular rate and rhythm with no murmurs appreciated Pulm: Normal work of breathing, clear to auscultation bilaterally with no crackles, wheezes, or rhonchi GI: Normal bowel sounds present. Soft, Nontender, Nondistended. MSK: No edema, cyanosis, or clubbing noted Skin: Warm, dry Neuro: Grossly normal, moves all extremities Psych: Normal affect and thought content      Kyden Potash M. , MD 11/28/2018 10:51 AM

## 2019-01-09 DIAGNOSIS — N760 Acute vaginitis: Secondary | ICD-10-CM | POA: Diagnosis not present

## 2019-01-09 DIAGNOSIS — Z1231 Encounter for screening mammogram for malignant neoplasm of breast: Secondary | ICD-10-CM | POA: Diagnosis not present

## 2019-01-09 DIAGNOSIS — N898 Other specified noninflammatory disorders of vagina: Secondary | ICD-10-CM | POA: Diagnosis not present

## 2019-01-09 DIAGNOSIS — Z1389 Encounter for screening for other disorder: Secondary | ICD-10-CM | POA: Diagnosis not present

## 2019-01-09 DIAGNOSIS — Z6829 Body mass index (BMI) 29.0-29.9, adult: Secondary | ICD-10-CM | POA: Diagnosis not present

## 2019-01-09 DIAGNOSIS — R3915 Urgency of urination: Secondary | ICD-10-CM | POA: Diagnosis not present

## 2019-01-09 DIAGNOSIS — Z01419 Encounter for gynecological examination (general) (routine) without abnormal findings: Secondary | ICD-10-CM | POA: Diagnosis not present

## 2019-01-09 DIAGNOSIS — Z130 Encounter for screening for diseases of the blood and blood-forming organs and certain disorders involving the immune mechanism: Secondary | ICD-10-CM | POA: Diagnosis not present

## 2019-01-11 ENCOUNTER — Encounter: Payer: Self-pay | Admitting: Family Medicine

## 2019-01-15 MED FILL — traZODone HCL 50 MG TABS: 50 | 30 days supply | Qty: 30 | Fill #1

## 2019-02-26 MED FILL — traZODone HCL 50 MG TABS: 50 | 30 days supply | Qty: 30 | Fill #2

## 2019-04-18 MED FILL — traZODone HCL 50 MG TABS: 50 | 30 days supply | Qty: 30 | Fill #3

## 2019-05-06 ENCOUNTER — Encounter: Payer: Self-pay | Admitting: Family Medicine

## 2019-05-06 ENCOUNTER — Ambulatory Visit (INDEPENDENT_AMBULATORY_CARE_PROVIDER_SITE_OTHER): Payer: No Typology Code available for payment source | Admitting: Family Medicine

## 2019-05-06 ENCOUNTER — Other Ambulatory Visit: Payer: Self-pay

## 2019-05-06 VITALS — BP 110/74 | HR 94 | Temp 98.1°F | Ht 63.0 in | Wt 167.8 lb

## 2019-05-06 DIAGNOSIS — M79672 Pain in left foot: Secondary | ICD-10-CM

## 2019-05-06 DIAGNOSIS — G4700 Insomnia, unspecified: Secondary | ICD-10-CM

## 2019-05-06 MED ORDER — NITROGLYCERIN 0.1 MG/HR TD PT24: MEDICATED_PATCH | TRANSDERMAL | 12 refills | Status: DC

## 2019-05-06 MED ORDER — DICLOFENAC SODIUM 75 MG PO TBEC: 75.0000 mg | DELAYED_RELEASE_TABLET | Freq: Two times a day (BID) | ORAL | 0 refills | Status: DC

## 2019-05-06 MED FILL — DICLOFENAC SODIUM 75 MG TAB: 75 | 15 days supply | Qty: 30 | Fill #0

## 2019-05-06 MED FILL — NITROGLYCERIN 0.1 MG/HR PTC: 0.1 | 88 days supply | Qty: 22 | Fill #0

## 2019-05-06 NOTE — Assessment & Plan Note (Signed)
Doing well with trazodone 25 to 50 mg nightly as needed.  Will continue.

## 2019-05-06 NOTE — Patient Instructions (Signed)
It was very nice to see you today!  You have Achilles tendinitis.  Please work on exercises.  Please use the anti-inflammatory nitroglycerin patch for couple weeks and let me know if not improving.  Take care, Dr Jimmey Ralph  Please try these tips to maintain a healthy lifestyle:   Eat at least 3 REAL meals and 1-2 snacks per day.  Aim for no more than 5 hours between eating.  If you eat breakfast, please do so within one hour of getting up.    Each meal should contain half fruits/vegetables, one quarter protein, and one quarter carbs (no bigger than a computer mouse)   Cut down on sweet beverages. This includes juice, soda, and sweet tea.     Drink at least 1 glass of water with each meal and aim for at least 8 glasses per day   Exercise at least 150 minutes every week.

## 2019-05-06 NOTE — Progress Notes (Signed)
   Alexandra Flynn is a 49 y.o. female who presents today for an office visit.  Assessment/Plan:  New/Acute Problems: Left Foot Pain Consistent with Achilles tendinitis.  No red flags.  She is already using orthotics for plantar fasciitis -we will hold off on heel lifts at this point.  Discussed home exercises and handout was given.  Will start course of diclofenac and topical nitroglycerin patch.  If not proving in the next couple of weeks would consider referral to sports medicine and/or physical therapy.  Chronic Problems Addressed Today: Insomnia Doing well with trazodone 25 to 50 mg nightly as needed.  Will continue.    Subjective:  HPI:  Patient here for left foot pain.  Started about a month ago.  Located in back of her left heel.  Worse with movement.  Radiates into her calf.  Has been doing a lot more walking and hiking which she thinks exacerbated the issue.  No specific treatments tried.      Objective:  Physical Exam: BP 110/74 (BP Location: Left Arm, Patient Position: Sitting, Cuff Size: Normal)   Pulse 94   Temp 98.1 F (36.7 C) (Temporal)   Ht 5\' 3"  (1.6 m)   Wt 167 lb 12.8 oz (76.1 kg)   LMP 04/10/2019   SpO2 98%   BMI 29.72 kg/m   Gen: No acute distress, resting comfortably MSK: Left foot with no obvious abnormalities.  Tender to palpation along distal aspect of Achilles tendon.  Normal Thompson test.      06/10/2019. Alexandra Degree, MD 05/06/2019 10:21 AM

## 2019-05-06 NOTE — Progress Notes (Signed)
CLINIC:

## 2019-06-23 ENCOUNTER — Other Ambulatory Visit: Payer: Self-pay | Admitting: Family Medicine

## 2019-06-23 MED FILL — traZODone HCL 50 MG TABS: 50 | 30 days supply | Qty: 30 | Fill #0

## 2019-06-23 NOTE — Telephone Encounter (Signed)
Ok with me. Please place any necessary orders. 

## 2019-06-23 NOTE — Telephone Encounter (Signed)
LAST APPOINTMENT DATE: 05/06/2019   NEXT APPOINTMENT DATE: Visit date not found   Rx Trazodone  LAST REFILL: 11/28/2018  QTY: 30 3RF

## 2019-06-29 ENCOUNTER — Encounter: Payer: Self-pay | Admitting: Family Medicine

## 2019-06-30 NOTE — Telephone Encounter (Signed)
Patient was advised to follow up with  Sports Medicine, asked if we could put in a referral for insurance purposes.

## 2019-06-30 NOTE — Telephone Encounter (Signed)
Please advise 

## 2019-07-01 ENCOUNTER — Other Ambulatory Visit: Payer: Self-pay | Admitting: *Deleted

## 2019-07-01 DIAGNOSIS — M79672 Pain in left foot: Secondary | ICD-10-CM

## 2019-07-01 NOTE — Telephone Encounter (Signed)
Referral placed.

## 2019-07-04 ENCOUNTER — Ambulatory Visit: Payer: Self-pay

## 2019-07-04 ENCOUNTER — Other Ambulatory Visit: Payer: Self-pay

## 2019-07-04 ENCOUNTER — Encounter: Payer: Self-pay | Admitting: Family Medicine

## 2019-07-04 ENCOUNTER — Ambulatory Visit: Payer: No Typology Code available for payment source | Admitting: Family Medicine

## 2019-07-04 VITALS — BP 104/70 | HR 85 | Ht 63.0 in | Wt 165.6 lb

## 2019-07-04 DIAGNOSIS — M79672 Pain in left foot: Secondary | ICD-10-CM

## 2019-07-04 DIAGNOSIS — M7662 Achilles tendinitis, left leg: Secondary | ICD-10-CM | POA: Diagnosis not present

## 2019-07-04 NOTE — Progress Notes (Signed)
Subjective:    I'm seeing this patient as a consultation for:  Dr. Jerline Pain. Note will be routed back to referring provider/PCP.  CC: L Achille's pain  I, Molly Weber, LAT, ATC, am serving as scribe for Dr. Lynne Leader.  HPI: Pt is a 49 y/o female presenting w/ L foot/heel pain since April 2021.  She saw her PCP on 05/06/19 and was prescribed nitroglycerin patches and oral Voltaren and shown a HEP.  She con't to have pain and locates her pain to her L Achille's.  She is doing some static stretching but not eccentric active stretching.  She also has custom orthotics but no heel lift.  She is able to walk about a mile and a half without hurting.  L foot swelling: slight L foot numbness/tingling: No Aggravating factors: First few steps after rising from sitting; prolonged walking Treatments tried: Nitroglycerin patches; HEP, oral Voltaren  Past medical history, Surgical hist ory, Family history, Social history, Allergies, and medications have been entered into the medical record, reviewed.   Review of Systems: No new headache, visual changes, nausea, vomiting, diarrhea, constipation, dizziness, abdominal pain, skin rash, fevers, chills, night sweats, weight loss, swollen lymph nodes, body aches, joint swelling, muscle aches, chest pain, shortness of breath, mood changes, visual or auditory hallucinations.   Objective:    Vitals:   07/04/19 0756  BP: 104/70  Pulse: 85  SpO2: 98%   General: Well Developed, well nourished, and in no acute distress.  Neuro/Psych: Alert and oriented x3, extra-ocular muscles intact, able to move all 4 extremities, sensation grossly intact. Skin: Warm and dry, no rashes noted.  Respiratory: Not using accessory muscles, speaking in full sentences, trachea midline.  Cardiovascular: Pulses palpable, no extremity edema. Abdomen: Does not appear distended. MSK:  Left Achilles tendon slightly swollen mid substance tendon approximately 2 to 3 cm proximal to  insertion of the calcaneus.  Minimally tender to palpation. Normal foot and ankle motion and strength. Pulses cap refill and sensation intact distally.  Lab and Radiology Results  Diagnostic Limited MSK Ultrasound of: Left Achilles tendon Achilles tendon is intact. Slight nodule present at mid substance tendon approximately 3 cm proximal to insertion on the calcaneus. Tendon fascicles are intact however hypoechoic change mildly present throughout tendon diffusely along the medial superficial aspect of the tendon. This is consistent in appearance with tendon nodule without tear. At insertion tiny hyperechoic change present. No significant retrocalcaneal bursitis present. Impression: Achilles tendon nodule   Impression and Recommendations:    Assessment and Plan: 49 y.o. female with left Achilles tendon nodule.  Already getting pretty good conservative management.  Will modify what she is doing.  We will add a small  heel lift at 3/16 inch to both orthotics.  Emphasize eccentric active stretching and strengthening.  Continue nitroglycerin patches.  Check back in 1 to 3 months if not improving especially if worsening.  Discussed the possibility of PRP injection however I do not think that will be necessary or desirable.  That should be used as a last resort prior to surgery for this condition.  PDMP not reviewed this encounter. Orders Placed This Encounter  Procedures  . [place] LIMITED JOINT SPACE STRUCTURES LOW LEFT(NO LINKED CHARGES)    Order Specific Question:   Reason for Exam (SYMPTOM  OR DIAGNOSIS REQUIRED)    Answer:   left heel pain    Order Specific Question:   Preferred imaging location?    Answer:   [place]  No orders of the defined types were placed in this encounter.   Discussed warning signs or symptoms. Please see discharge instructions. Patient expresses understanding.   The above documentation has been reviewed and is accurate and complete  Clementeen Graham, M.D.

## 2019-07-04 NOTE — Patient Instructions (Signed)
Thank you for coming in today.  Do the eccentric heel exercise.  Go from up to down slowly.  It should take 3-4 seconds.  10-30 reps 2-3x daily.  Try the heel lifts. If they are more annoying than helpful get rid of them.  Ok to get more lifts from [email or web address]. Yours were 21/2 x 3/16  Continue nitro patches.   If not better could do PRP (platelet rich plasma) injection in the future but this should be considered almost a trying to prevent surgery injection.   This will take a while to get better.

## 2019-09-22 MED FILL — NITROGLYCERIN 0.1 MG/HR PTC: 0.1 | 88 days supply | Qty: 22 | Fill #1

## 2019-11-12 ENCOUNTER — Other Ambulatory Visit (HOSPITAL_COMMUNITY): Payer: Self-pay

## 2019-11-12 MED FILL — CEFDINIR 300 MG CAPSULE: 300 | 7 days supply | Qty: 14 | Fill #0

## 2019-11-12 MED FILL — ERYTHROMYCIN EYE OINTMENT: 5 | 7 days supply | Qty: 4 | Fill #0

## 2020-01-13 ENCOUNTER — Other Ambulatory Visit (HOSPITAL_COMMUNITY): Payer: Self-pay

## 2020-01-13 MED FILL — PREVIDENT 5000 SENSITIVE PA: 1.1-5 | 25 days supply | Qty: 100 | Fill #0

## 2020-06-01 ENCOUNTER — Encounter: Payer: Self-pay | Admitting: Family

## 2020-06-01 ENCOUNTER — Telehealth (INDEPENDENT_AMBULATORY_CARE_PROVIDER_SITE_OTHER): Payer: No Typology Code available for payment source | Admitting: Family

## 2020-06-01 VITALS — Ht 63.0 in | Wt 165.6 lb

## 2020-06-01 DIAGNOSIS — U071 COVID-19: Secondary | ICD-10-CM

## 2020-06-01 DIAGNOSIS — R5383 Other fatigue: Secondary | ICD-10-CM

## 2020-06-01 NOTE — Progress Notes (Signed)
   Virtual Visit via Video   I connected with patient on 06/01/20 at  9:20 AM EDT by a video enabled telemedicine application and verified that I am speaking with the correct person using two identifiers.  Location patient: Home Location provider: [facility], Office Persons participating in the virtual visit: Worthy Rancher  I discussed the limitations of evaluation and management by telemedicine and the availability of in person appointments. The patient expressed understanding and agreed to proceed.  Subjective:   HPI:   50 year old female presents via video visit today with concerns of persistent cough, congestion, fatigue after being diagnosed with COVID on May 13.  She is back to work.  However, symptoms persist.  She has been taken over-the-counter medications that have helped.  Just concerned and wanted to see if there was anything more that can be done.  ROS:   See pertinent positives and negatives per HPI.  Patient Active Problem List   Diagnosis Date Noted  . Tendonitis, Achilles, left 07/04/2019  . Insomnia 11/28/2018  . Plantar fasciitis 11/28/2018  . Dyslipidemia 11/28/2018    Social History   Tobacco Use  . Smoking status: Never Smoker  . Smokeless tobacco: Never Used  Substance Use Topics  . Alcohol use: Yes    Alcohol/week: 1.0 standard drink    Types: 1 Glasses of wine per week    Current Outpatient Medications:  Marland Kitchen  Multiple Vitamin (MULTI-VITAMIN DAILY PO), Multi Vitamin, Disp: , Rfl:  .  PREVIDENT 5000 SENSITIVE 1.1-5 % GEL, USE AS DIRECTED, Disp: 100 g, Rfl: 2 .  cefdinir (OMNICEF) 300 MG capsule, TAKE 1 CAPSULE BY MOUTH EVERY 12 HOURS, Disp: 14 capsule, Rfl: 0 .  cyclobenzaprine (FLEXERIL) 10 MG tablet, Take 1 tablet (10 mg total) by mouth 3 (three) times daily as needed for muscle spasms (may cause drowsiness). (Patient not taking: Reported on 05/06/2019), Disp: 15 tablet, Rfl: 0 .  erythromycin ophthalmic ointment, APPLY 1/2" RIBBON INTO  BOTH EYES THREE TIMES DAILY FOR 7 DAYS, Disp: 1 g, Rfl: 0 .  nitroGLYCERIN (NITRODUR - DOSED IN MG/24 HR) 0.1 mg/hr patch, Place 1/4th patch to affected area daily., Disp: 30 patch, Rfl: 12 .  traZODone (DESYREL) 50 MG tablet, TAKE 0.5-1 TABLETS (25-50 MG TOTAL) BY MOUTH AT BEDTIME AS NEEDED FOR SLEEP. (Patient not taking: Reported on 06/01/2020), Disp: 30 tablet, Rfl: 3  No Known Allergies  Objective:   Ht 5\' 3"  (1.6 m)   Wt 165 lb 9.1 oz (75.1 kg)   BMI 29.33 kg/m   Patient is well-developed, well-nourished in no acute distress.  Resting comfortably at work.  Head is normocephalic, atraumatic.  No labored breathing.  Speech is clear and coherent with logical content.  Patient is alert and oriented at baseline.    Assessment and Plan:    Alexandra Flynn was seen today for covid positive, headache, cough, nasal congestion and shortness of breath.  Diagnoses and all orders for this visit:  COVID-19  Other fatigue   Reassurance provided.  Rest.  Drink plenty of fluids.  Continue over-the-counter symptomatic treatment for relief.  Call the office with any questions or concerns.  Victorino Dike, FNP 06/01/2020

## 2020-06-01 NOTE — Patient Instructions (Signed)

## 2020-08-25 ENCOUNTER — Other Ambulatory Visit (HOSPITAL_COMMUNITY): Payer: Self-pay

## 2020-08-25 MED ORDER — CARESTART COVID-19 HOME TEST VI KIT
PACK | 0 refills | Status: DC
  Filled 2020-08-25: qty 4, 4d supply, fill #0

## 2020-08-25 MED FILL — Sodium Fluoride-Potassium Nitrate Gel 1.1-5%: DENTAL | 30 days supply | Qty: 100 | Fill #0 | Status: AC

## 2020-08-26 ENCOUNTER — Other Ambulatory Visit (HOSPITAL_COMMUNITY): Payer: Self-pay

## 2020-12-15 ENCOUNTER — Encounter: Payer: Self-pay | Admitting: Family Medicine

## 2020-12-15 ENCOUNTER — Telehealth: Payer: No Typology Code available for payment source | Admitting: Emergency Medicine

## 2020-12-15 ENCOUNTER — Other Ambulatory Visit (HOSPITAL_COMMUNITY): Payer: Self-pay

## 2020-12-15 DIAGNOSIS — M545 Low back pain, unspecified: Secondary | ICD-10-CM

## 2020-12-15 MED ORDER — CYCLOBENZAPRINE HCL 10 MG PO TABS
10.0000 mg | ORAL_TABLET | Freq: Three times a day (TID) | ORAL | 0 refills | Status: DC | PRN
  Filled 2020-12-15: qty 10, 4d supply, fill #0

## 2020-12-15 NOTE — Telephone Encounter (Signed)
See note

## 2020-12-15 NOTE — Progress Notes (Signed)
We are sorry that you are not feeling well.  Here is how we plan to help!  Based on what you have shared with me it looks like you mostly have acute back pain.  Acute back pain is defined as musculoskeletal pain that can resolve in 1-3 weeks with conservative treatment.  I recommend Ibuprofen 600mg  3x per day and have prescribed Flexeril 10 mg every eight hours as needed which is a muscle relaxer  Some patients experience stomach irritation or in increased heartburn with anti-inflammatory drugs.  Please keep in mind that muscle relaxer's can cause fatigue and should not be taken while at work or driving.  Back pain is very common.  The pain often gets better over time.  The cause of back pain is usually not dangerous.  Most people can learn to manage their back pain on their own.  Home Care Stay active.  Start with short walks on flat ground if you can.  Try to walk farther each day. Do not sit, drive or stand in one place for more than 30 minutes.  Do not stay in bed. Do not avoid exercise or work.  Activity can help your back heal faster. Be careful when you bend or lift an object.  Bend at your knees, keep the object close to you, and do not twist. Sleep on a firm mattress.  Lie on your side, and bend your knees.  If you lie on your back, put a pillow under your knees. Only take medicines as told by your doctor. Put ice on the injured area. Put ice in a plastic bag Place a towel between your skin and the bag Leave the ice on for 15-20 minutes, 3-4 times a day for the first 2-3 days. 210 After that, you can switch between ice and heat packs. Ask your doctor about back exercises or massage. Avoid feeling anxious or stressed.  Find good ways to deal with stress, such as exercise.  Get Help Right Way If: Your pain does not go away with rest or medicine. Your pain does not go away in 1 week. You have new problems. You do not feel well. The pain spreads into your legs. You cannot control  when you poop (bowel movement) or pee (urinate) You feel sick to your stomach (nauseous) or throw up (vomit) You have belly (abdominal) pain. You feel like you may pass out (faint). If you develop a fever.  Make Sure you: Understand these instructions. Will watch your condition Will get help right away if you are not doing well or get worse.  Your e-visit answers were reviewed by a board certified advanced clinical practitioner to complete your personal care plan.  Depending on the condition, your plan could have included both over the counter or prescription medications.  If there is a problem please reply  once you have received a response from your provider.  Your safety is important to .  If you have drug allergies check your prescription carefully.    You can use MyChart to ask questions about today's visit, request a non-urgent call back, or ask for a work or school excuse for 24 hours related to this e-Visit. If it has been greater than 24 hours you will need to follow up with your provider, or enter a new e-Visit to address those concerns.  You will get an e-mail in the next two days asking about your experience.  I hope that your e-visit has been valuable and will speed your  recovery. Thank you for using e-visits.  Approximately 5 minutes was used in reviewing the patient's chart, questionnaire, prescribing medications, and documentation.

## 2021-01-04 ENCOUNTER — Other Ambulatory Visit (HOSPITAL_COMMUNITY): Payer: Self-pay

## 2021-01-04 MED ORDER — TOLTERODINE TARTRATE ER 4 MG PO CP24
ORAL_CAPSULE | ORAL | 12 refills | Status: DC
  Filled 2021-01-04: qty 30, 30d supply, fill #0
  Filled 2021-02-17: qty 30, 30d supply, fill #1
  Filled 2021-03-24: qty 30, 30d supply, fill #2
  Filled 2021-04-27: qty 30, 30d supply, fill #3
  Filled 2021-05-30: qty 30, 30d supply, fill #4
  Filled 2021-07-01: qty 30, 30d supply, fill #5
  Filled 2021-08-04: qty 30, 30d supply, fill #6
  Filled 2021-09-06: qty 30, 30d supply, fill #7
  Filled 2021-10-12: qty 30, 30d supply, fill #8
  Filled 2021-11-09: qty 30, 30d supply, fill #9
  Filled 2021-12-13: qty 30, 30d supply, fill #10

## 2021-02-17 ENCOUNTER — Other Ambulatory Visit (HOSPITAL_COMMUNITY): Payer: Self-pay

## 2021-03-24 ENCOUNTER — Other Ambulatory Visit (HOSPITAL_COMMUNITY): Payer: Self-pay

## 2021-04-27 ENCOUNTER — Other Ambulatory Visit (HOSPITAL_COMMUNITY): Payer: Self-pay

## 2021-05-30 ENCOUNTER — Other Ambulatory Visit (HOSPITAL_COMMUNITY): Payer: Self-pay

## 2021-07-02 ENCOUNTER — Other Ambulatory Visit (HOSPITAL_COMMUNITY): Payer: Self-pay

## 2021-08-05 ENCOUNTER — Other Ambulatory Visit (HOSPITAL_COMMUNITY): Payer: Self-pay

## 2021-09-06 ENCOUNTER — Other Ambulatory Visit (HOSPITAL_COMMUNITY): Payer: Self-pay

## 2021-09-09 ENCOUNTER — Encounter: Payer: Self-pay | Admitting: Family Medicine

## 2021-09-09 ENCOUNTER — Ambulatory Visit (INDEPENDENT_AMBULATORY_CARE_PROVIDER_SITE_OTHER): Payer: No Typology Code available for payment source | Admitting: Family Medicine

## 2021-09-09 ENCOUNTER — Other Ambulatory Visit (HOSPITAL_COMMUNITY): Payer: Self-pay

## 2021-09-09 VITALS — BP 123/80 | HR 99 | Temp 98.2°F | Ht 63.0 in | Wt 173.8 lb

## 2021-09-09 DIAGNOSIS — M79605 Pain in left leg: Secondary | ICD-10-CM

## 2021-09-09 DIAGNOSIS — Z131 Encounter for screening for diabetes mellitus: Secondary | ICD-10-CM

## 2021-09-09 DIAGNOSIS — R739 Hyperglycemia, unspecified: Secondary | ICD-10-CM | POA: Diagnosis not present

## 2021-09-09 DIAGNOSIS — Z0001 Encounter for general adult medical examination with abnormal findings: Secondary | ICD-10-CM | POA: Diagnosis not present

## 2021-09-09 DIAGNOSIS — Z1211 Encounter for screening for malignant neoplasm of colon: Secondary | ICD-10-CM

## 2021-09-09 DIAGNOSIS — E785 Hyperlipidemia, unspecified: Secondary | ICD-10-CM | POA: Diagnosis not present

## 2021-09-09 DIAGNOSIS — R519 Headache, unspecified: Secondary | ICD-10-CM | POA: Diagnosis not present

## 2021-09-09 DIAGNOSIS — G4700 Insomnia, unspecified: Secondary | ICD-10-CM

## 2021-09-09 LAB — COMPREHENSIVE METABOLIC PANEL
ALT: 21 U/L (ref 0–35)
AST: 41 U/L — ABNORMAL HIGH (ref 0–37)
Albumin: 4.5 g/dL (ref 3.5–5.2)
Alkaline Phosphatase: 56 U/L (ref 39–117)
BUN: 23 mg/dL (ref 6–23)
CO2: 28 mEq/L (ref 19–32)
Calcium: 10.4 mg/dL (ref 8.4–10.5)
Chloride: 105 mEq/L (ref 96–112)
Creatinine, Ser: 1.09 mg/dL (ref 0.40–1.20)
GFR: 59.55 mL/min — ABNORMAL LOW (ref >60.00)
Glucose, Bld: 98 mg/dL (ref 70–99)
Potassium: 4.3 mEq/L (ref 3.5–5.1)
Sodium: 144 mEq/L (ref 135–145)
Total Bilirubin: 0.3 mg/dL (ref 0.2–1.2)
Total Protein: 8.1 g/dL (ref 6.0–8.3)

## 2021-09-09 LAB — CBC
HCT: 41.6 % (ref 36.0–46.0)
Hemoglobin: 14.1 g/dL (ref 12.0–15.0)
MCHC: 33.9 g/dL (ref 30.0–36.0)
MCV: 91.3 fl (ref 78.0–100.0)
Platelets: 251 10*3/uL (ref 150.0–400.0)
RBC: 4.56 Mil/uL (ref 3.87–5.11)
RDW: 13.1 % (ref 11.5–15.5)
WBC: 7.1 10*3/uL (ref 4.0–10.5)

## 2021-09-09 LAB — LIPID PANEL
Cholesterol: 280 mg/dL — ABNORMAL HIGH (ref 0–200)
HDL: 60.3 mg/dL (ref >39.00)
LDL Cholesterol: 185 mg/dL — ABNORMAL HIGH (ref 0–99)
NonHDL: 219.83
Total CHOL/HDL Ratio: 5
Triglycerides: 173 mg/dL — ABNORMAL HIGH (ref 0.0–149.0)
VLDL: 34.6 mg/dL (ref 0.0–40.0)

## 2021-09-09 LAB — TSH: TSH: 1.3 u[IU]/mL (ref 0.35–5.50)

## 2021-09-09 LAB — HEMOGLOBIN A1C: Hgb A1c MFr Bld: 6.2 % (ref 4.6–6.5)

## 2021-09-09 MED ORDER — AMITRIPTYLINE HCL 25 MG PO TABS
25.0000 mg | ORAL_TABLET | Freq: Every day | ORAL | 3 refills | Status: AC
  Filled 2021-09-09: qty 90, 90d supply, fill #0
  Filled 2022-05-03: qty 90, 90d supply, fill #1

## 2021-09-09 NOTE — Patient Instructions (Signed)
It was very nice to see you today!  I think you have some inflammation in the muscles and connective tissues in the outer part of your leg.  You may also have a pinched nerve.  Please work on the exercises and let me know if not improving in the next couple of weeks.  Sounds like he may be having slight migraines.  This could be due to hormonal changes as you approach menopause.  Your ibuprofen is also probably making these headaches worse.  Please try to use only once or twice per week if needed.  We will start amitriptyline.  This should help with your sleep but this will also help with migraine prevention.  Please continue to work on diet and exercise.  We will refer you for a colonoscopy.  I will see you back in a year for your next physical.  Please come back to see [place] sooner if needed.  Take care, Dr Jerline Pain  PLEASE NOTE:  If you had any lab tests please let us know if you have not heard back within a few days. You may see your results on mychart before we have a chance to review them but we will give you a call once they are reviewed by [place]. If we ordered any referrals today, please let us know if you have not heard from their office within the next week.   Please try these tips to maintain a healthy lifestyle:  Eat at least 3 REAL meals and 1-2 snacks per day.  Aim for no more than 5 hours between eating.  If you eat breakfast, please do so within one hour of getting up.   Each meal should contain half fruits/vegetables, one quarter protein, and one quarter carbs (no bigger than a computer mouse)  Cut down on sweet beverages. This includes juice, soda, and sweet tea.   Drink at least 1 glass of water with each meal and aim for at least 8 glasses per day  Exercise at least 150 minutes every week.    Preventive Care 32-70 Years Old, Female Preventive care refers to lifestyle choices and visits with your health care provider that can promote health and wellness. Preventive care  visits are also called wellness exams. What can I expect for my preventive care visit? Counseling Your health care provider may ask you questions about your: Medical history, including: Past medical problems. Family medical history. Pregnancy history. Current health, including: Menstrual cycle. Method of birth control. Emotional well-being. Home life and relationship well-being. Sexual activity and sexual health. Lifestyle, including: Alcohol, nicotine or tobacco, and drug use. Access to firearms. Diet, exercise, and sleep habits. Work and work Statistician. Sunscreen use. Safety issues such as seatbelt and bike helmet use. Physical exam Your health care provider will check your: Height and weight. These may be used to calculate your BMI (body mass index). BMI is a measurement that tells if you are at a healthy weight. Waist circumference. This measures the distance around your waistline. This measurement also tells if you are at a healthy weight and may help predict your risk of certain diseases, such as type 2 diabetes and high blood pressure. Heart rate and blood pressure. Body temperature. Skin for abnormal spots. What immunizations do I need?  Vaccines are usually given at various ages, according to a schedule. Your health care provider will recommend vaccines for you based on your age, medical history, and lifestyle or other factors, such as travel or where you work. What tests  do I need? Screening Your health care provider may recommend screening tests for certain conditions. This may include: Lipid and cholesterol levels. Diabetes screening. This is done by checking your blood sugar (glucose) after you have not eaten for a while (fasting). Pelvic exam and Pap test. Hepatitis B test. Hepatitis C test. HIV (human immunodeficiency virus) test. STI (sexually transmitted infection) testing, if you are at risk. Lung cancer screening. Colorectal cancer screening. Mammogram.  Talk with your health care provider about when you should start having regular mammograms. This may depend on whether you have a family history of breast cancer. BRCA-related cancer screening. This may be done if you have a family history of breast, ovarian, tubal, or peritoneal cancers. Bone density scan. This is done to screen for osteoporosis. Talk with your health care provider about your test results, treatment options, and if necessary, the need for more tests. Follow these instructions at home: Eating and drinking  Eat a diet that includes fresh fruits and vegetables, whole grains, lean protein, and low-fat dairy products. Take vitamin and mineral supplements as recommended by your health care provider. Do not drink alcohol if: Your health care provider tells you not to drink. You are pregnant, may be pregnant, or are planning to become pregnant. If you drink alcohol: Limit how much you have to 0-1 drink a day. Know how much alcohol is in your drink. In the [place] one drink equals one 12 oz bottle of beer (355 mL), one 5 oz glass of wine (148 mL), or one 1 oz glass of hard liquor (44 mL). Lifestyle Brush your teeth every morning and night with fluoride toothpaste. Floss one time each day. Exercise for at least 30 minutes 5 or more days each week. Do not use any products that contain nicotine or tobacco. These products include cigarettes, chewing tobacco, and vaping devices, such as e-cigarettes. If you need help quitting, ask your health care provider. Do not use drugs. If you are sexually active, practice safe sex. Use a condom or other form of protection to prevent STIs. If you do not wish to become pregnant, use a form of birth control. If you plan to become pregnant, see your health care provider for a prepregnancy visit. Take aspirin only as told by your health care provider. Make sure that you understand how much to take and what form to take. Work with your health care provider to  find out whether it is safe and beneficial for you to take aspirin daily. Find healthy ways to manage stress, such as: Meditation, yoga, or listening to music. Journaling. Talking to a trusted person. Spending time with friends and family. Minimize exposure to UV radiation to reduce your risk of skin cancer. Safety Always wear your seat belt while driving or riding in a vehicle. Do not drive: If you have been drinking alcohol. Do not ride with someone who has been drinking. When you are tired or distracted. While texting. If you have been using any mind-altering substances or drugs. Wear a helmet and other protective equipment during sports activities. If you have firearms in your house, make sure you follow all gun safety procedures. Seek help if you have been physically or sexually abused. What's next? Visit your health care provider once a year for an annual wellness visit. Ask your health care provider how often you should have your eyes and teeth checked. Stay up to date on all vaccines. This information is not intended to replace advice given to you by  your health care provider. Make sure you discuss any questions you have with your health care provider. Document Revised: 06/23/2020 Document Reviewed: 06/23/2020 Elsevier Patient Education  [place].    Analgesic Rebound Headache An analgesic rebound headache, sometimes called a medication overuse headache or a drug-induced headache, is a secondary disorder that is caused by the overuse of pain medicine (analgesic) to treat the original (primary) headache. Any type of primary headache can return as a rebound headache if a person regularly takes analgesics. The types of primary headaches that are commonly associated with rebound headaches include: Migraines. Headaches that are caused by tense muscles in the head and neck area (tension headaches). Headaches that develop and happen again on one side of the head and around  the eye (cluster headaches). If rebound headaches continue, they can become long-term, daily headaches. What are the causes? This condition may be caused by frequent use of: Over-the-counter medicines such as aspirin, ibuprofen, and acetaminophen. Sinus-relief medicines and medicines that contain caffeine. Narcotic pain medicines such as codeine and oxycodone. Some prescription migraine medicines. What are the signs or symptoms? The symptoms of a rebound headache are the same as the symptoms of the original headache. Some of the symptoms of specific types of headaches include: Migraine headache Pulsing or throbbing pain on one or both sides of the head. Severe pain that interferes with daily activities. Pain that gets worse with physical activity. Nausea, vomiting, or both. Pain and sensitivity with exposure to bright light, loud noises, or strong smells. Visual changes. Numbness of one or both arms. Tension headache Pressure around the head. Dull, aching head pain. Pain felt over the front and sides of the head. Tenderness in the muscles of the head, neck, and shoulders. Cluster headache Severe pain that begins in or around one eye or temple. Droopy or swollen eyelid, or redness and tearing in the eye on the same side as the pain. One-sided head pain. Nausea. Runny nose. Sweaty, pale facial skin. Restlessness. How is this diagnosed? This condition is diagnosed by: Reviewing your medical history. This includes the nature of your primary headaches. Reviewing the types of pain medicines that you have been using to treat your primary headaches and how often you take them. How is this treated? This condition may be treated or managed by: Discontinuing frequent use of the analgesic medicine. Doing this may worsen your headaches at first, but the pain should eventually become more manageable, less frequent, and less severe. Seeing a headache specialist. He or she may be able to help  you manage your headaches and help make sure there is not another cause of the headaches. Using methods of stress relief, such as acupuncture, counseling, biofeedback, and massage. Talk with your health care provider about which methods might be good for you. Follow these instructions at home: Medicines  Take over-the-counter and prescription medicines only as told by your health care provider. Stop the repeated use of pain medicine as told by your health care provider. Stopping can be difficult. Carefully follow instructions from your health care provider. Lifestyle  Follow a regular sleep schedule. Do not vary the time that you go to bed or the amount that you sleep from day to day. It is important to stay on the same schedule to help prevent headaches. Get 7-9 hours of sleep each night, or the amount recommended by your health care provider. Exercise regularly. Exercise for at least 30 minutes, 5 times each week. Limit or manage stress. Consider stress-relief  options such as acupuncture, counseling, biofeedback, and massage. Talk with your health care provider about which methods might be good for you. Do not drink alcohol. Do not use any products that contain nicotine or tobacco, such as cigarettes, e-cigarettes, and chewing tobacco. If you need help quitting, ask your health care provider. General instructions Avoid triggers that are known to cause your primary headaches. Keep all follow-up visits as told by your health care provider. This is important. Contact a health care provider if: You continue to experience headaches after following treatments that your health care provider recommended. Get help right away if you have: New headache pain. Headache pain that is different than what you have experienced in the past. Numbness or tingling in your arms or legs. Changes in your speech or vision. Summary An analgesic rebound headache, sometimes called a medication overuse headache or a  drug-induced headache, is caused by the overuse of pain medicine (analgesic) to treat the original (primary) headache. Any type of primary headache can return as a rebound headache if a person regularly takes analgesics. The types of primary headaches that are commonly associated with rebound headaches include migraines, tension headaches, and cluster headaches. Analgesic rebound headaches can occur with frequent use of over-the-counter medicines and prescription medicines. Treatment involves stopping the medicine that is being overused. This will improve headache frequency and severity. This information is not intended to replace advice given to you by your health care provider. Make sure you discuss any questions you have with your health care provider. Document Revised: 01/23/2019 Document Reviewed: 01/23/2019 Elsevier Patient Education  [place].

## 2021-09-09 NOTE — Assessment & Plan Note (Signed)
Did not find trazodone particularly effective.  Given her daily headaches as above we will try nightly amitriptyline which should help with her insomnia as well.

## 2021-09-09 NOTE — Assessment & Plan Note (Addendum)
Discussed lifestyle modifications.  Check labs. 

## 2021-09-09 NOTE — Progress Notes (Signed)
Chief Complaint:  Alexandra Flynn is a 51 y.o. female who presents today for her annual comprehensive physical exam.    Assessment/Plan:  New/Acute Problems: Headaches No red flags.  Reassuring neuro exam today.  She has been using ibuprofen daily and likely has some element of rebound headaches.  Does sound like she probably had migraines when she was pregnant and she does have some elements of migraine such as light sensitivity with her most recent headaches.  Given her reassuring her exam do not think we need to get imaging today.  We discussed importance of limiting use of ibuprofen or Tylenol to no more than once or twice weekly.  We discussed preventative medications.  We will try low-dose amitriptyline as below.  She will let me know if symptoms or not improving over the next 1 to 2 weeks or if she develops any new symptoms.  We will consider imaging versus referral to neurology at that point.  Left leg pain No red flags.  Reassuring exam.  She does have some hip abductor weakness which is likely contributing.  I also have some pain in her IT band.  We discussed home exercises and handout was given.  She will let me know if not proving in the next 1 to 2 weeks and we can refer to sports medicine.  Chronic Problems Addressed Today: Dyslipidemia Discussed lifestyle modifications.  Check labs.  Insomnia Did not find trazodone particularly effective.  Given her daily headaches as above we will try nightly amitriptyline which should help with her insomnia as well.  Preventative Healthcare: Gets Pap and mammogram through GYN.  We will check labs today.  Place referral for colonoscopy.  Patient Counseling(The following topics were reviewed and/or handout was given):  -Nutrition: Stressed importance of moderation in sodium/caffeine intake, saturated fat and cholesterol, caloric balance, sufficient intake of fresh fruits, vegetables, and fiber.  -Stressed the importance of regular  exercise.   -Substance Abuse: Discussed cessation/primary prevention of tobacco, alcohol, or other drug use; driving or other dangerous activities under the influence; availability of treatment for abuse.   -Injury prevention: Discussed safety belts, safety helmets, smoke detector, smoking near bedding or upholstery.   -Sexuality: Discussed sexually transmitted diseases, partner selection, use of condoms, avoidance of unintended pregnancy and contraceptive alternatives.   -Dental health: Discussed importance of regular tooth brushing, flossing, and dental visits.  -Health maintenance and immunizations reviewed. Please refer to Health maintenance section.  Return to care in 1 year for next preventative visit.     Subjective:  HPI:  She has no acute complaints today.   She has had some pain in her left thigh. Feels like a dull aching pain. No numbness or tingling. No specific treatments. Ibuprofen does not seem to help.  She has been also having issues with headaches. Has been going on for a few months. Has pain across the front of her head. Some light sensitivity.She had similar headaches in the past when she was pregnant several years ago.  This went away after pregnancy.  She has noticed that her vision has been worsening but she attributes this to age.  She is having daily headaches.  Sometimes she is waking up with headache. No weakness. No numbness or tingling. She takes ibuprofen most days with 431m daily. No nausea or vomiting.   Lifestyle Diet: None specific Exercise: Trying to walk routinely.      09/09/2021    1:01 PM  Depression screen PHQ 2/9  Decreased Interest 0  Down, Depressed, Hopeless 0  PHQ - 2 Score 0    Health Maintenance Due  Topic Date Due   COVID-19 Vaccine (1) Never done   HIV Screening  Never done   Hepatitis C Screening  Never done   TETANUS/TDAP  Never done   COLONOSCOPY (Pts 45-58yr Insurance coverage will need to be confirmed)  Never done   PAP  SMEAR-Modifier  11/28/2020     ROS: Per HPI, otherwise a complete review of systems was negative.   PMH:  The following were reviewed and entered/updated in epic: Past Medical History:  Diagnosis Date   Hyperlipidemia    Patient Active Problem List   Diagnosis Date Noted   Tendonitis, Achilles, left 07/04/2019   Insomnia 11/28/2018   Plantar fasciitis 11/28/2018   Dyslipidemia 11/28/2018   History reviewed. No pertinent surgical history.  Family History  Problem Relation Age of Onset   Diabetes Mother    Hypertension Mother    Hyperlipidemia Mother    Rheum arthritis Mother    Hyperlipidemia Father    Hypertension Father     Medications- reviewed and updated Current Outpatient Medications  Medication Sig Dispense Refill   amitriptyline (ELAVIL) 25 MG tablet Take 1 tablet (25 mg total) by mouth at bedtime. 90 tablet 3   COVID-19 At Home Antigen Test (CARESTART COVID-19 HOME TEST) KIT USE AS DIRECTED 4 each 0   Multiple Vitamin (MULTI-VITAMIN DAILY PO) Multi Vitamin     tolterodine (DETROL LA) 4 MG 24 hr capsule Take 1 capsule by mouth once a day 30 capsule 12   No current facility-administered medications for this visit.    Allergies-reviewed and updated No Known Allergies  Social History   Socioeconomic History   Marital status: Married    Spouse name: Not on file   Number of children: Not on file   Years of education: Not on file   Highest education level: Not on file  Occupational History   Not on file  Tobacco Use   Smoking status: Never   Smokeless tobacco: Never  Vaping Use   Vaping Use: Never used  Substance and Sexual Activity   Alcohol use: Yes    Alcohol/week: 1.0 standard drink of alcohol    Types: 1 Glasses of wine per week   Drug use: No   Sexual activity: Not on file  Other Topics Concern   Not on file  Social History Narrative   Not on file   Social Determinants of Health   Financial Resource Strain: Not on file  Food Insecurity:  Not on file  Transportation Needs: Not on file  Physical Activity: Not on file  Stress: Not on file  Social Connections: Not on file        Objective:  Physical Exam: BP 123/80   Pulse 99   Temp 98.2 F (36.8 C)   Ht _0  (1.6 m)   Wt 173 lb 12.8 oz (78.8 kg)   LMP 08/04/2021 (Exact Date)   SpO2 97%   BMI 30.79 kg/m   Body mass index is 30.79 kg/m. Wt Readings from Last 3 Encounters:  09/09/21 173 lb 12.8 oz (78.8 kg)  06/01/20 165 lb 9.1 oz (75.1 kg)  07/04/19 165 lb 9.6 oz (75.1 kg)   Gen: NAD, resting comfortably HEENT: TMs normal bilaterally. OP clear. No thyromegaly noted.  CV: RRR with no murmurs appreciated Pulm: NWOB, CTAB with no crackles, wheezes, or rhonchi GI: Normal bowel sounds present. Soft, Nontender, Nondistended. MSK: no edema,  cyanosis, or clubbing noted - Left Leg: No deformities.  Slight weakness with hip abduction.  Neurovascular intact distally Skin: warm, dry Neuro: CN2-12 grossly intact. Strength 5/5 in upper and lower extremities. Reflexes symmetric and intact bilaterally.  Psych: Normal affect and thought content     Jeremih Dearmas M. Jerline Pain, MD 09/09/2021 1:40 PM

## 2021-09-14 ENCOUNTER — Encounter: Payer: Self-pay | Admitting: Family Medicine

## 2021-09-14 DIAGNOSIS — R739 Hyperglycemia, unspecified: Secondary | ICD-10-CM | POA: Insufficient documentation

## 2021-09-14 NOTE — Progress Notes (Signed)
Please inform patient of the following:  One of her liver numbers is very slightly elevated.  This is probably nothing to worry about but I would like for her to come back to recheck c-Met.  Her cholesterol is high.  Do not need to start meds but she should continue to work on diet and exercise and we can recheck this in a year.  Her A1c is borderline elevated.  Do not need to start meds but she should also work on diet and exercise for this.  We can also recheck this in a year.  All of her other labs are normal.  No clear explanation for her headache.  Would like for her to let us know if her headache is not improving with the medication prescribed at her last office visit.

## 2021-10-10 ENCOUNTER — Other Ambulatory Visit (HOSPITAL_COMMUNITY): Payer: Self-pay

## 2021-10-10 ENCOUNTER — Other Ambulatory Visit: Payer: Self-pay | Admitting: Family Medicine

## 2021-10-10 MED ORDER — PREVIDENT 5000 SENSITIVE 1.1-5 % DT GEL
DENTAL | 2 refills | Status: AC
  Filled 2021-10-10: qty 100, 30d supply, fill #0
  Filled 2022-07-05: qty 100, 30d supply, fill #1

## 2021-10-11 ENCOUNTER — Other Ambulatory Visit (HOSPITAL_COMMUNITY): Payer: Self-pay

## 2021-10-12 ENCOUNTER — Other Ambulatory Visit (HOSPITAL_COMMUNITY): Payer: Self-pay

## 2021-10-14 ENCOUNTER — Other Ambulatory Visit (HOSPITAL_COMMUNITY): Payer: Self-pay

## 2021-10-17 ENCOUNTER — Other Ambulatory Visit: Payer: Self-pay | Admitting: *Deleted

## 2021-10-17 DIAGNOSIS — R748 Abnormal levels of other serum enzymes: Secondary | ICD-10-CM

## 2021-10-25 ENCOUNTER — Other Ambulatory Visit: Payer: Self-pay

## 2021-10-25 DIAGNOSIS — R748 Abnormal levels of other serum enzymes: Secondary | ICD-10-CM

## 2021-11-03 ENCOUNTER — Encounter: Payer: Self-pay | Admitting: Internal Medicine

## 2021-11-09 ENCOUNTER — Other Ambulatory Visit (HOSPITAL_COMMUNITY): Payer: Self-pay

## 2021-11-10 ENCOUNTER — Other Ambulatory Visit (HOSPITAL_COMMUNITY): Payer: Self-pay

## 2021-11-17 ENCOUNTER — Ambulatory Visit (AMBULATORY_SURGERY_CENTER): Payer: No Typology Code available for payment source

## 2021-11-17 ENCOUNTER — Other Ambulatory Visit (HOSPITAL_COMMUNITY): Payer: Self-pay

## 2021-11-17 VITALS — Ht 63.0 in | Wt 170.0 lb

## 2021-11-17 DIAGNOSIS — Z1211 Encounter for screening for malignant neoplasm of colon: Secondary | ICD-10-CM

## 2021-11-17 MED ORDER — NA SULFATE-K SULFATE-MG SULF 17.5-3.13-1.6 GM/177ML PO SOLN
1.0000 | Freq: Once | ORAL | 0 refills | Status: AC
  Filled 2021-11-17: qty 354, 1d supply, fill #0

## 2021-11-17 NOTE — Progress Notes (Signed)
No egg or soy allergy known to patient   Pt states no past sedation with any surgeries or procedures  intubation   No FH of Malignant Hyperthermia  Pt is not on diet pills  Pt is not on  home 02   Pt is not on blood thinners   Pt denies issues with constipation   No A fib or A flutter  Have any cardiac testing pending--no  Pt instructed to use [email or web address] or GoodRx for a price reduction on prep    Patient's chart reviewed by Cathlyn Parsons CNRA prior to previsit and patient appropriate for the [facility].  Previsit completed and red dot placed by patient's name on their procedure day (on provider's schedule).

## 2021-11-19 ENCOUNTER — Other Ambulatory Visit (HOSPITAL_COMMUNITY): Payer: Self-pay

## 2021-12-07 ENCOUNTER — Encounter: Payer: Self-pay | Admitting: Internal Medicine

## 2021-12-07 ENCOUNTER — Encounter: Payer: Self-pay | Admitting: Gastroenterology

## 2021-12-08 ENCOUNTER — Ambulatory Visit (AMBULATORY_SURGERY_CENTER): Payer: No Typology Code available for payment source | Admitting: Internal Medicine

## 2021-12-08 ENCOUNTER — Encounter: Payer: Self-pay | Admitting: Internal Medicine

## 2021-12-08 VITALS — BP 118/77 | HR 73 | Temp 98.2°F | Resp 14 | Ht 63.0 in | Wt 170.0 lb

## 2021-12-08 DIAGNOSIS — Z1211 Encounter for screening for malignant neoplasm of colon: Secondary | ICD-10-CM | POA: Diagnosis present

## 2021-12-08 MED ORDER — SODIUM CHLORIDE 0.9 % IV SOLN: 500.0000 mL | INTRAVENOUS | Status: DC

## 2021-12-08 NOTE — Patient Instructions (Signed)
Resume previous diet and continue present medications. Repeat colonoscopy in 10 years for surveillance purposes!  YOU HAD AN ENDOSCOPIC PROCEDURE TODAY AT THE [facility]:   Refer to the procedure report that was given to you for any specific questions about what was found during the examination.  If the procedure report does not answer your questions, please call your gastroenterologist to clarify.  If you requested that your care partner not be given the details of your procedure findings, then the procedure report has been included in a sealed envelope for you to review at your convenience later.  YOU SHOULD EXPECT: Some feelings of bloating in the abdomen. Passage of more gas than usual.  Walking can help get rid of the air that was put into your GI tract during the procedure and reduce the bloating. If you had a lower endoscopy (such as a colonoscopy or flexible sigmoidoscopy) you may notice spotting of blood in your stool or on the toilet paper. If you underwent a bowel prep for your procedure, you may not have a normal bowel movement for a few days.  Please Note:  You might notice some irritation and congestion in your nose or some drainage.  This is from the oxygen used during your procedure.  There is no need for concern and it should clear up in a day or so.  SYMPTOMS TO REPORT IMMEDIATELY:  Following lower endoscopy (colonoscopy or flexible sigmoidoscopy):  Excessive amounts of blood in the stool  Significant tenderness or worsening of abdominal pains  Swelling of the abdomen that is new, acute  Fever of 100F or higher  For urgent or emergent issues, a gastroenterologist can be reached at any hour by calling [phone]. Do not use MyChart messaging for urgent concerns.    DIET:  We do recommend a small meal at first, but then you may proceed to your regular diet.  Drink plenty of fluids but you should avoid alcoholic beverages for 24 hours.  ACTIVITY:  You should  plan to take it easy for the rest of today and you should NOT DRIVE or use heavy machinery until tomorrow (because of the sedation medicines used during the test).    FOLLOW UP: Our staff will call the number listed on your records the next business day following your procedure.  We will call around 7:15- 8:00 am to check on you and address any questions or concerns that you may have regarding the information given to you following your procedure. If we do not reach you, we will leave a message.     If any biopsies were taken you will be contacted by phone or by letter within the next 1-3 weeks.  Please call us at [phone] if you have not heard about the biopsies in 3 weeks.    SIGNATURES/CONFIDENTIALITY: You and/or your care partner have signed paperwork which will be entered into your electronic medical record.  These signatures attest to the fact that that the information above on your After Visit Summary has been reviewed and is understood.  Full responsibility of the confidentiality of this discharge information lies with you and/or your care-partner. 

## 2021-12-08 NOTE — Op Note (Signed)
[facility] Patient Name: Alexandra Flynn Procedure Date: 12/08/2021 2:45 PM MRN: [id number] Endoscopist: Wilhemina Bonito. Marina Goodell , MD, [id number] Age: 51 Referring MD:  Date of Birth: 06-Jun-1970 Gender: Female Account #: [id number] Procedure:                Colonoscopy Indications:              Screening for colorectal malignant neoplasm Medicines:                Monitored Anesthesia Care Procedure:                Pre-Anesthesia Assessment:                           - Prior to the procedure, a History and Physical                            was performed, and patient medications and                            allergies were reviewed. The patient's tolerance of                            previous anesthesia was also reviewed. The risks                            and benefits of the procedure and the sedation                            options and risks were discussed with the patient.                            All questions were answered, and informed consent                            was obtained. Prior Anticoagulants: The patient has                            taken no anticoagulant or antiplatelet agents. ASA                            Grade Assessment: I - A normal, healthy patient.                            After reviewing the risks and benefits, the patient                            was deemed in satisfactory condition to undergo the                            procedure.                           After obtaining informed consent, the colonoscope  was passed under direct vision. Throughout the                            procedure, the patient's blood pressure, pulse, and                            oxygen saturations were monitored continuously. The                            Olympus CF-HQ190L (Serial# [id number]) Colonoscope was                            introduced through the anus and advanced to the the                            cecum, identified by  appendiceal orifice and                            ileocecal valve. The ileocecal valve, appendiceal                            orifice, and rectum were photographed. The quality                            of the bowel preparation was excellent. The                            colonoscopy was performed without difficulty. The                            patient tolerated the procedure well. The bowel                            preparation used was SUPREP via split dose                            instruction. Scope In: 2:58:14 PM Scope Out: 3:10:54 PM Scope Withdrawal Time: 0 hours 9 minutes 7 seconds  Total Procedure Duration: 0 hours 12 minutes 40 seconds  Findings:                 The entire examined colon appeared normal on direct                            and retroflexion views. Complications:            No immediate complications. Estimated blood loss:                            None. Estimated Blood Loss:     Estimated blood loss: none. Impression:               - The entire examined colon is normal on direct and                            retroflexion  views.                           - No specimens collected. Recommendation:           - Repeat colonoscopy in 10 years for screening                            purposes.                           - Patient has a contact number available for                            emergencies. The signs and symptoms of potential                            delayed complications were discussed with the                            patient. Return to normal activities tomorrow.                            Written discharge instructions were provided to the                            patient.                           - Resume previous diet.                           - Continue present medications. Wilhemina Bonito. Marina Goodell, MD 12/08/2021 3:20:05 PM This report has been signed electronically.

## 2021-12-08 NOTE — Progress Notes (Signed)
A/ox3, pleased with MAC, report to RN 

## 2021-12-08 NOTE — Progress Notes (Signed)
HISTORY OF PRESENT ILLNESS:  Alexandra Flynn is a 52 y.o. female presents for screening colonoscopy.  No complaints  REVIEW OF SYSTEMS:  All non-GI ROS negative except for  Past Medical History:  Diagnosis Date   Hyperlipidemia     Past Surgical History:  Procedure Laterality Date   WISDOM TOOTH EXTRACTION      Social History Alexandra Flynn  reports that she has never smoked. She has never used smokeless tobacco. She reports current alcohol use of about 1.0 standard drink of alcohol per week. She reports that she does not use drugs.  family history includes Diabetes in her mother; Hyperlipidemia in her father and mother; Hypertension in her father and mother; Rheum arthritis in her mother.  No Known Allergies     PHYSICAL EXAMINATION: Vital signs: BP 135/72   Pulse 88   Temp 98.2 F (36.8 C)   Ht 5\' 3"  (1.6 m)   Wt 170 lb (77.1 kg)   LMP 11/28/2021 (Exact Date) Comment: states irreg periods, husband has a vasectemy  SpO2 98%   BMI 30.11 kg/m  General: Well-developed, well-nourished, no acute distress HEENT: Sclerae are anicteric, conjunctiva pink. Oral mucosa intact Lungs: Clear Heart: Regular Abdomen: soft, nontender, nondistended, no obvious ascites, no peritoneal signs, normal bowel sounds. No organomegaly. Extremities: No edema Psychiatric: alert and oriented x3. Cooperative     ASSESSMENT:  Colon cancer screening   PLAN:  Screening colonoscopy

## 2021-12-09 ENCOUNTER — Telehealth: Payer: Self-pay | Admitting: *Deleted

## 2021-12-09 NOTE — Telephone Encounter (Signed)
No answer on  follow up call. Left message.   

## 2021-12-13 ENCOUNTER — Other Ambulatory Visit (HOSPITAL_COMMUNITY): Payer: Self-pay

## 2021-12-19 ENCOUNTER — Other Ambulatory Visit (HOSPITAL_COMMUNITY): Payer: Self-pay

## 2022-01-16 ENCOUNTER — Other Ambulatory Visit (HOSPITAL_COMMUNITY): Payer: Self-pay

## 2022-01-16 MED ORDER — TOLTERODINE TARTRATE ER 4 MG PO CP24
4.0000 mg | ORAL_CAPSULE | Freq: Every day | ORAL | 12 refills | Status: DC
  Filled 2022-01-16: qty 30, 30d supply, fill #0

## 2022-01-23 DIAGNOSIS — Z124 Encounter for screening for malignant neoplasm of cervix: Secondary | ICD-10-CM | POA: Diagnosis not present

## 2022-01-23 DIAGNOSIS — R3915 Urgency of urination: Secondary | ICD-10-CM | POA: Diagnosis not present

## 2022-01-23 DIAGNOSIS — N898 Other specified noninflammatory disorders of vagina: Secondary | ICD-10-CM | POA: Diagnosis not present

## 2022-01-23 DIAGNOSIS — Z6831 Body mass index (BMI) 31.0-31.9, adult: Secondary | ICD-10-CM | POA: Diagnosis not present

## 2022-01-23 DIAGNOSIS — Z01419 Encounter for gynecological examination (general) (routine) without abnormal findings: Secondary | ICD-10-CM | POA: Diagnosis not present

## 2022-01-23 DIAGNOSIS — Z1151 Encounter for screening for human papillomavirus (HPV): Secondary | ICD-10-CM | POA: Diagnosis not present

## 2022-01-23 DIAGNOSIS — Z1389 Encounter for screening for other disorder: Secondary | ICD-10-CM | POA: Diagnosis not present

## 2022-01-23 DIAGNOSIS — Z130 Encounter for screening for diseases of the blood and blood-forming organs and certain disorders involving the immune mechanism: Secondary | ICD-10-CM | POA: Diagnosis not present

## 2022-01-23 DIAGNOSIS — Z1231 Encounter for screening mammogram for malignant neoplasm of breast: Secondary | ICD-10-CM | POA: Diagnosis not present

## 2022-01-24 DIAGNOSIS — H5211 Myopia, right eye: Secondary | ICD-10-CM | POA: Diagnosis not present

## 2022-01-26 ENCOUNTER — Other Ambulatory Visit (HOSPITAL_COMMUNITY): Payer: Self-pay

## 2022-01-27 ENCOUNTER — Other Ambulatory Visit (HOSPITAL_COMMUNITY): Payer: Self-pay

## 2022-01-27 MED ORDER — SOLIFENACIN SUCCINATE 10 MG PO TABS
10.0000 mg | ORAL_TABLET | Freq: Every day | ORAL | 12 refills | Status: DC
  Filled 2022-01-27: qty 30, 30d supply, fill #0
  Filled 2022-02-28: qty 30, 30d supply, fill #1
  Filled 2022-04-02: qty 30, 30d supply, fill #2
  Filled 2022-05-03: qty 30, 30d supply, fill #3
  Filled 2022-06-07: qty 30, 30d supply, fill #4
  Filled 2022-07-05: qty 30, 30d supply, fill #5
  Filled 2022-08-04: qty 30, 30d supply, fill #6
  Filled 2022-09-13: qty 30, 30d supply, fill #7
  Filled 2022-10-16: qty 30, 30d supply, fill #8
  Filled 2022-11-14: qty 30, 30d supply, fill #9
  Filled 2022-12-17: qty 30, 30d supply, fill #10
  Filled 2023-01-21: qty 30, 30d supply, fill #11

## 2022-02-21 ENCOUNTER — Encounter: Payer: Self-pay | Admitting: Family Medicine

## 2022-02-21 NOTE — Telephone Encounter (Signed)
Spoke with patient advise to schedule and OV or Urgent Care visit for evaluation and possible Xray  Patient verbalized understanding

## 2022-02-22 ENCOUNTER — Telehealth: Payer: Self-pay | Admitting: *Deleted

## 2022-02-22 NOTE — Telephone Encounter (Signed)
Opened in error

## 2022-02-27 DIAGNOSIS — M25571 Pain in right ankle and joints of right foot: Secondary | ICD-10-CM | POA: Diagnosis not present

## 2022-03-28 DIAGNOSIS — F4320 Adjustment disorder, unspecified: Secondary | ICD-10-CM | POA: Diagnosis not present

## 2022-04-03 ENCOUNTER — Other Ambulatory Visit (HOSPITAL_COMMUNITY): Payer: Self-pay

## 2022-04-03 ENCOUNTER — Other Ambulatory Visit: Payer: Self-pay

## 2022-04-06 ENCOUNTER — Other Ambulatory Visit (HOSPITAL_COMMUNITY): Payer: Self-pay

## 2022-04-19 DIAGNOSIS — F4320 Adjustment disorder, unspecified: Secondary | ICD-10-CM | POA: Diagnosis not present

## 2022-05-04 ENCOUNTER — Other Ambulatory Visit: Payer: Self-pay

## 2022-05-10 DIAGNOSIS — F4320 Adjustment disorder, unspecified: Secondary | ICD-10-CM | POA: Diagnosis not present

## 2022-05-12 ENCOUNTER — Ambulatory Visit
Admission: EM | Admit: 2022-05-12 | Discharge: 2022-05-12 | Disposition: A | Discharge status: Home / Self Care | Payer: Commercial Managed Care - PPO | Attending: Family Medicine | Admitting: Family Medicine

## 2022-05-12 ENCOUNTER — Encounter: Payer: Self-pay | Admitting: Emergency Medicine

## 2022-05-12 DIAGNOSIS — H6692 Otitis media, unspecified, left ear: Secondary | ICD-10-CM

## 2022-05-12 DIAGNOSIS — H6123 Impacted cerumen, bilateral: Secondary | ICD-10-CM

## 2022-05-12 MED ORDER — AMOXICILLIN-POT CLAVULANATE 875-125 MG PO TABS: 1.0000 | ORAL_TABLET | Freq: Two times a day (BID) | ORAL | 0 refills | Status: AC

## 2022-05-12 NOTE — ED Provider Notes (Signed)
[facility]    CSN: [id number] Arrival date & time: 05/12/22  1753      History   Chief Complaint Chief Complaint  Patient presents with   Otalgia    HPI Alexandra Flynn is a 52 y.o. female.   HPI Very pleasant 52 year old female presents with left ear pain.  PMH significant for obesity, discs lipidemia, and insomnia.  Past Medical History:  Diagnosis Date   Hyperlipidemia     Patient Active Problem List   Diagnosis Date Noted   Hyperglycemia 09/14/2021   Tendonitis, Achilles, left 07/04/2019   Insomnia 11/28/2018   Plantar fasciitis 11/28/2018   Dyslipidemia 11/28/2018    Past Surgical History:  Procedure Laterality Date   WISDOM TOOTH EXTRACTION      OB History   No obstetric history on file.      Home Medications    Prior to Admission medications   Medication Sig Start Date End Date Taking? Authorizing Provider  amitriptyline (ELAVIL) 25 MG tablet Take 1 tablet (25 mg total) by mouth at bedtime. 09/09/21  Yes Ardith Dark, MD  amoxicillin-clavulanate (AUGMENTIN) 875-125 MG tablet Take 1 tablet by mouth 2 (two) times daily for 10 days. 05/12/22 05/22/22 Yes Trevor Iha, FNP  Multiple Vitamin (MULTI-VITAMIN DAILY PO) Multi Vitamin   Yes [provider]  PREVIDENT 5000 SENSITIVE 1.1-5 % GEL USE AS DIRECTED 10/10/21 10/10/22 Yes Ardith Dark, MD  solifenacin (VESICARE) 10 MG tablet Take 1 tablet (10 mg total) by mouth daily. 01/26/22  Yes   COVID-19 At Home Antigen Test [facility] COVID-19 HOME TEST) KIT USE AS DIRECTED 08/25/20   Andrez Grime, RPH  tolterodine (DETROL LA) 4 MG 24 hr capsule Take 1 capsule by mouth once a day 01/16/22       Family History Family History  Problem Relation Age of Onset   Diabetes Mother    Hypertension Mother    Hyperlipidemia Mother    Rheum arthritis Mother    Hyperlipidemia Father    Hypertension Father    Colon cancer Neg Hx    Colon polyps Neg Hx    Esophageal cancer Neg Hx    Rectal  cancer Neg Hx    Stomach cancer Neg Hx     Social History Social History   Tobacco Use   Smoking status: Never   Smokeless tobacco: Never  Vaping Use   Vaping Use: Never used  Substance Use Topics   Alcohol use: Yes    Alcohol/week: 1.0 standard drink of alcohol    Types: 1 Glasses of wine per week    Comment: 1-2 per week of any   Drug use: Never     Allergies   Patient has no known allergies.   Review of Systems Review of Systems  HENT:  Positive for ear pain.   All other systems reviewed and are negative.    Physical Exam Triage Vital Signs ED Triage Vitals  Enc Vitals Group     BP      Pulse      Resp      Temp      Temp src      SpO2      Weight      Height      Head Circumference      Peak Flow      Pain Score      Pain Loc      Pain Edu?      Excl. in GC?  No data found.  Updated Vital Signs BP 117/77 (BP Location: Right Arm)   Pulse 81   Temp 98.5 F (36.9 C) (Oral)   Ht 5\' 3"  (1.6 m)   Wt 170 lb (77.1 kg)   LMP 05/11/2022 (Exact Date)   SpO2 97%   BMI 30.11 kg/m      Physical Exam Vitals and nursing note reviewed.  Constitutional:      Appearance: Normal appearance. She is obese.  HENT:     Head: Normocephalic and atraumatic.     Right Ear: External ear normal.     Left Ear: External ear normal.     Ears:     Comments: Bilateral EAC's occluded with excessive cerumen unable to visualize either TM.  Bilateral ear lavage: Left EAC-clear, mildly erythematous from previous cerumen impaction, Left TM-erythematous, bulging; Right EAC-clear, mildly erythematous from previous cerumen impaction, Right TM-red rimmed, retracted Eyes:     Extraocular Movements: Extraocular movements intact.     Conjunctiva/sclera: Conjunctivae normal.     Pupils: Pupils are equal, round, and reactive to light.  Cardiovascular:     Rate and Rhythm: Normal rate and regular rhythm.     Pulses: Normal pulses.     Heart sounds: Normal heart sounds.   Pulmonary:     Effort: Pulmonary effort is normal.     Breath sounds: Normal breath sounds. No wheezing, rhonchi or rales.  Musculoskeletal:        General: Normal range of motion.     Cervical back: Normal range of motion and neck supple.  Skin:    General: Skin is warm and dry.  Neurological:     General: No focal deficit present.     Mental Status: She is alert and oriented to person, place, and time. Mental status is at baseline.      UC Treatments / Results  Labs (all labs ordered are listed, but only abnormal results are displayed) Labs Reviewed - No data to display  EKG   Radiology No results found.  Procedures Procedures (including critical care time)  Medications Ordered in UC Medications - No data to display  Initial Impression / Assessment and Plan / UC Course  I have reviewed the triage vital signs and the nursing notes.  Pertinent labs & imaging results that were available during my care of the patient were reviewed by me and considered in my medical decision making (see chart for details).     MDM: 1.  Acute left otitis media-Rx'd Augmentin 875/125 mg twice daily x 10 days; 2.  Bilateral impacted cerumen-resolved with bilateral ear lavage. Instructed patient to take medication as directed with food to completion.  Encouraged increase daily water intake to 64 ounces per day while taking this medication.  Advised if symptoms worsen and/or unresolved please follow-up PCP or here for further evaluation.  Final Clinical Impressions(s) / UC Diagnoses   Final diagnoses:  Acute left otitis media  Bilateral impacted cerumen     Discharge Instructions      Instructed patient to take medication as directed with food to completion.  Encouraged increase daily water intake to 64 ounces per day while taking this medication.  Advised if symptoms worsen and/or unresolved please follow-up PCP or here for further evaluation.     ED Prescriptions     Medication  Sig Dispense Auth. Provider   amoxicillin-clavulanate (AUGMENTIN) 875-125 MG tablet Take 1 tablet by mouth 2 (two) times daily for 10 days. 20 tablet Trevor Iha, FNP  PDMP not reviewed this encounter.   Trevor Iha, FNP 05/12/22 [phone]

## 2022-05-12 NOTE — Discharge Instructions (Addendum)
Instructed patient to take medication as directed with food to completion.  Encouraged increase daily water intake to 64 ounces per day while taking this medication.  Advised if symptoms worsen and/or unresolved please follow-up PCP or here for further evaluation.

## 2022-05-12 NOTE — ED Triage Notes (Signed)
Patient c/o dull left ear pain x 2 days.  No other sx's.  Denies any OTC pain meds.

## 2022-05-24 DIAGNOSIS — F4320 Adjustment disorder, unspecified: Secondary | ICD-10-CM | POA: Diagnosis not present

## 2022-06-26 DIAGNOSIS — F4320 Adjustment disorder, unspecified: Secondary | ICD-10-CM | POA: Diagnosis not present

## 2022-07-05 ENCOUNTER — Other Ambulatory Visit (HOSPITAL_COMMUNITY): Payer: Self-pay

## 2022-07-06 ENCOUNTER — Other Ambulatory Visit: Payer: Self-pay

## 2022-07-19 DIAGNOSIS — F4320 Adjustment disorder, unspecified: Secondary | ICD-10-CM | POA: Diagnosis not present

## 2022-10-18 ENCOUNTER — Other Ambulatory Visit (HOSPITAL_COMMUNITY): Payer: Self-pay

## 2022-11-15 ENCOUNTER — Other Ambulatory Visit (HOSPITAL_COMMUNITY): Payer: Self-pay

## 2022-12-19 ENCOUNTER — Other Ambulatory Visit (HOSPITAL_COMMUNITY): Payer: Self-pay

## 2023-01-17 DIAGNOSIS — H5202 Hypermetropia, left eye: Secondary | ICD-10-CM | POA: Diagnosis not present

## 2023-02-06 ENCOUNTER — Other Ambulatory Visit (HOSPITAL_COMMUNITY): Payer: Self-pay

## 2023-02-06 DIAGNOSIS — Z130 Encounter for screening for diseases of the blood and blood-forming organs and certain disorders involving the immune mechanism: Secondary | ICD-10-CM | POA: Diagnosis not present

## 2023-02-06 DIAGNOSIS — R3915 Urgency of urination: Secondary | ICD-10-CM | POA: Diagnosis not present

## 2023-02-06 DIAGNOSIS — Z01419 Encounter for gynecological examination (general) (routine) without abnormal findings: Secondary | ICD-10-CM | POA: Diagnosis not present

## 2023-02-06 DIAGNOSIS — N925 Other specified irregular menstruation: Secondary | ICD-10-CM | POA: Diagnosis not present

## 2023-02-06 DIAGNOSIS — Z1389 Encounter for screening for other disorder: Secondary | ICD-10-CM | POA: Diagnosis not present

## 2023-02-06 DIAGNOSIS — Z6831 Body mass index (BMI) 31.0-31.9, adult: Secondary | ICD-10-CM | POA: Diagnosis not present

## 2023-02-06 DIAGNOSIS — N898 Other specified noninflammatory disorders of vagina: Secondary | ICD-10-CM | POA: Diagnosis not present

## 2023-02-06 MED ORDER — SOLIFENACIN SUCCINATE 10 MG PO TABS
10.0000 mg | ORAL_TABLET | Freq: Every day | ORAL | 4 refills | Status: AC
  Filled 2023-02-20: qty 90, 90d supply, fill #0
  Filled 2023-05-27: qty 90, 90d supply, fill #1
  Filled 2023-08-30: qty 90, 90d supply, fill #2
  Filled 2023-12-19: qty 90, 90d supply, fill #3

## 2023-02-20 ENCOUNTER — Other Ambulatory Visit (HOSPITAL_COMMUNITY): Payer: Self-pay

## 2023-02-20 ENCOUNTER — Other Ambulatory Visit: Payer: Self-pay

## 2023-03-09 DIAGNOSIS — Z1321 Encounter for screening for nutritional disorder: Secondary | ICD-10-CM | POA: Diagnosis not present

## 2023-05-04 NOTE — Progress Notes (Signed)
   I, Miquel Amen, CMA acting as a scribe for Garlan Juniper, MD.  Alexandra Flynn is a 53 y.o. female who presents to [facility] today for L heel pain x 3-4 weeks. Pt locates pain to the heel of the left foot. Walks 1.5 miles at lunch daily. Notes some pain during walk, sat to go back to work, stood back up and had terrible pain lasting throughout the remainder of the day. Wears Brooks shoes to work and while walking. Sx worse with ambulation and WB.   Aggravates: WB, ambulation Treatments tried: IBU  Pertinent review of systems: No fever or chills  Relevant historical information: Dyslipidemia plantar fasciitis and Achilles tendinitis.   Exam:  BP 132/80   Pulse 86   Ht 5\' 3"  (1.6 m)   Wt 174 lb (78.9 kg)   SpO2 99%   BMI 30.82 kg/m  General: Well Developed, well nourished, and in no acute distress.   MSK: Left heel is normal-appearing no significant swelling or nodules visible. Normal foot and ankle motion. Not particularly tender palpation. Intact strength.    Lab and Radiology Results  Diagnostic Limited MSK Ultrasound of: Left heel Achilles tendon: Intact Achilles tendon without significant swelling or tears.  Tiny calcaneal spur is present.  No retrocalcaneal bursitis. Plantar fascia normal-appearing on ultrasound. Impression: Mild Achilles tendinitis possible plantar fasciitis.     Assessment and Plan: 53 y.o. female with left heel pain.  This is recurrent problem.  She had a similar episode of Achilles tendinitis on her left heel about 4 years ago.  Today's pain is a little different as it also includes a plantar fascia area.  Plan for home exercise program icing and Voltaren  gel.  If not improving next step would be PT which we can arrange for here.  If that does not work would try shockwave and/or MRI.   PDMP not reviewed this encounter. Orders Placed This Encounter  Procedures   US  LIMITED JOINT SPACE STRUCTURES LOW  LEFT(NO LINKED CHARGES)    Reason for Exam (SYMPTOM  OR DIAGNOSIS REQUIRED):   left heel pain    Preferred imaging location?:   [facility]   No orders of the defined types were placed in this encounter.    Discussed warning signs or symptoms. Please see discharge instructions. Patient expresses understanding.   The above documentation has been reviewed and is accurate and complete Garlan Juniper, M.D.

## 2023-05-07 ENCOUNTER — Encounter: Payer: Self-pay | Admitting: Family Medicine

## 2023-05-07 ENCOUNTER — Other Ambulatory Visit: Payer: Self-pay

## 2023-05-07 ENCOUNTER — Ambulatory Visit: Admitting: Family Medicine

## 2023-05-07 VITALS — BP 132/80 | HR 86 | Ht 63.0 in | Wt 174.0 lb

## 2023-05-07 DIAGNOSIS — M79672 Pain in left foot: Secondary | ICD-10-CM

## 2023-05-07 DIAGNOSIS — M7662 Achilles tendinitis, left leg: Secondary | ICD-10-CM | POA: Diagnosis not present

## 2023-05-07 DIAGNOSIS — M722 Plantar fascial fibromatosis: Secondary | ICD-10-CM | POA: Diagnosis not present

## 2023-05-07 NOTE — Patient Instructions (Addendum)
 Thank you for coming in today.   Please work on the home exercises the athletic trainer went over with you:  View at [email or web address] code [email or web address]  Ice massage  Let me know if not improving, and I will refer you to physical therapy

## 2023-06-25 ENCOUNTER — Encounter: Payer: Self-pay | Admitting: Family Medicine

## 2023-06-27 ENCOUNTER — Telehealth: Admitting: Physician Assistant

## 2023-06-27 ENCOUNTER — Other Ambulatory Visit (HOSPITAL_COMMUNITY): Payer: Self-pay

## 2023-06-27 ENCOUNTER — Other Ambulatory Visit: Payer: Self-pay

## 2023-06-27 DIAGNOSIS — Z87898 Personal history of other specified conditions: Secondary | ICD-10-CM | POA: Diagnosis not present

## 2023-06-27 MED ORDER — SCOPOLAMINE 1 MG/3DAYS TD PT72
1.0000 | MEDICATED_PATCH | TRANSDERMAL | 0 refills | Status: DC
Start: 2023-06-27 — End: 2023-08-15
  Filled 2023-06-27: qty 10, 30d supply, fill #0

## 2023-06-27 NOTE — Progress Notes (Signed)
E Visit for Motion Sickness  We are sorry that you are not feeling well. Here is how we plan to help!  Based on what you have shared with me it looks like you have symptoms of motion sickness.  I have prescribed a medication that will help prevent or alleviate your symptoms:  Scopolamine Transdermal 1 mg patch behind ear at least 4 hours prior to travel (preferably 12 hours)   Prevention:  You might feel better if you keep your eyes focused on outside while you are in motion. For example, if you are in a car, sit in the front and look in the direction you are moving; if you are on a boat, stay on the deck and look to the horizon. This helps make what you see match the movement you are feeling, and so you are less likely to feel sick.  You should also avoid reading, watching a movie, texting or reading messages, or looking at things close to you inside the vehicle you are riding in.  . Use the seat head rest. Lean your head against the back of the seat or head rest when traveling in vehicles with seats to minimize head movements.  . On a ship: When making your reservations, choose a cabin in the middle of the ship and near the waterline. When on board, go up on deck and focus on the horizon.  . In an airplane: Request a window seat and look out the window. A seat over the front edge of the wing is the most preferable spot (the degree of motion is the lowest here). Direct the air vent to blow cool air on your face.  . On a train: Always face forward and sit near a window.  . In a vehicle: Sit in the front seat; if you are the passenger, look at the scenery in the distance. For some people, driving the vehicle (rather than being a passenger) is an instant remedy.  . Avoid others who have become nauseous with motion sickness. Seeing and smelling others who have motion sickness may cause you to become sick.  GET HELP RIGHT AWAY IF:   Your symptoms do not improve or worsen within 2 days  after treatment.   You cannot keep down fluids after trying the medication.   Other associated symptoms such as severe headache, visual field changes, fever, or intractable nausea and vomiting.  MAKE SURE YOU:   Understand these instructions.  Will watch your condition.  Will get help right away if you are not doing well or get worse.  Thank you for choosing an e-visit.  Your e-visit answers were reviewed by a board certified advanced clinical practitioner to complete your personal care plan. Depending upon the condition, your plan could have included both over the counter or prescription medications.  Please review your pharmacy choice. Be sure that the pharmacy you have chosen is open so that you can pick up your prescription now.  If there is a problem you may message your provider in MyChart to have the prescription routed to another pharmacy.  Your safety is important to us. If you have drug allergies check your prescription carefully.   For the next 24 hours, you can use MyChart to ask questions about today's visit, request a non-urgent call back, or ask for a work or school excuse from your e-visit provider.  You will get an e-mail in the next two days asking about your experience. I hope that your e-visit has been   valuable and will speed your recovery.   References or for more information: https://wwwnc.cdc.gov/travel/yellowbook/2020/travel-by-air-land-sea/motion-sickness https://my.[facility].org/health/articles/12782-motion-sickness https://www.uptodate.com  

## 2023-06-27 NOTE — Progress Notes (Signed)
 I have spent 5 minutes in review of e-visit questionnaire, review and updating patient chart, medical decision making and response to patient.   Piedad Climes, PA-C

## 2023-06-29 ENCOUNTER — Other Ambulatory Visit (HOSPITAL_COMMUNITY): Payer: Self-pay

## 2023-07-02 ENCOUNTER — Ambulatory Visit: Admitting: Family Medicine

## 2023-08-03 ENCOUNTER — Other Ambulatory Visit (HOSPITAL_COMMUNITY): Payer: Self-pay

## 2023-08-03 ENCOUNTER — Telehealth: Admitting: Emergency Medicine

## 2023-08-03 DIAGNOSIS — R42 Dizziness and giddiness: Secondary | ICD-10-CM | POA: Diagnosis not present

## 2023-08-03 MED ORDER — MECLIZINE HCL 25 MG PO TABS
25.0000 mg | ORAL_TABLET | Freq: Three times a day (TID) | ORAL | 0 refills | Status: AC | PRN
  Filled 2023-08-03: qty 30, 10d supply, fill #0

## 2023-08-03 NOTE — Patient Instructions (Signed)
  Alexandra Flynn, thank you for joining Lamar Schlossman, PA-C for today's virtual visit.  While this provider is not your primary care provider (PCP), if your PCP is located in our provider database this encounter information will be shared with them immediately following your visit.   A [facility] MyChart account gives you access to today's visit and all your visits, tests, and labs performed at [facility]  click here if you don't have a [facility] MyChart account or go to [email or web address]  Consent: (Patient) Alexandra CROME Heick provided verbal consent for this virtual visit at the beginning of the encounter.  Current Medications:  Current Outpatient Medications:    meclizine (ANTIVERT) 25 MG tablet, Take 1 tablet (25 mg total) by mouth 3 (three) times daily as needed for dizziness., Disp: 30 tablet, Rfl: 0   amitriptyline  (ELAVIL ) 25 MG tablet, Take 1 tablet (25 mg total) by mouth at bedtime., Disp: 90 tablet, Rfl: 3   COVID-19 At Home Antigen Test (CARESTART COVID-19 HOME TEST) KIT, USE AS DIRECTED, Disp: 4 each, Rfl: 0   Multiple Vitamin (MULTI-VITAMIN DAILY PO), Multi Vitamin, Disp: , Rfl:    scopolamine  (TRANSDERM-SCOP) 1 MG/3DAYS, Place 1 patch (1.5 mg total) onto the skin every 3 (three) days., Disp: 10 patch, Rfl: 0   solifenacin  (VESICARE ) 10 MG tablet, Take 1 tablet (10 mg total) by mouth daily., Disp: 90 tablet, Rfl: 4   Medications ordered in this encounter:  Meds ordered this encounter  Medications   meclizine (ANTIVERT) 25 MG tablet    Sig: Take 1 tablet (25 mg total) by mouth 3 (three) times daily as needed for dizziness.    Dispense:  30 tablet    Refill:  0    Supervising Provider:   LAMPTEY, PHILIP O [[id number]]     *If you need refills on other medications prior to your next appointment, please contact your pharmacy*  Follow-Up: Call back or seek an in-person evaluation if the symptoms worsen or if the condition fails to improve as  anticipated.  [facility] [phone]  Other Instructions   If you have been instructed to have an in-person evaluation today at a local Urgent Care facility, please use the link below. It will take you to a list of all of our available [facility], including address, phone number and hours of operation. Please do not delay care.  [facility]  If you or a family member do not have a primary care provider, use the link below to schedule a visit and establish care. When you choose a [facility] primary care physician or advanced practice provider, you gain a long-term partner in health. Find a Primary Care Provider  Learn more about [facility] in-office and virtual care options: [facility] - Get Care Now

## 2023-08-03 NOTE — Progress Notes (Signed)
 Virtual Visit Consent   Alexandra Flynn, you are scheduled for a virtual visit with a [facility] provider today. Just as with appointments in the office, your consent must be obtained to participate. Your consent will be active for this visit and any virtual visit you may have with one of our providers in the next 365 days. If you have a MyChart account, a copy of this consent can be sent to you electronically.  As this is a virtual visit, video technology does not allow for your provider to perform a traditional examination. This may limit your provider's ability to fully assess your condition. If your provider identifies any concerns that need to be evaluated in person or the need to arrange testing (such as labs, EKG, etc.), we will make arrangements to do so. Although advances in technology are sophisticated, we cannot ensure that it will always work on either your end or our end. If the connection with a video visit is poor, the visit may have to be switched to a telephone visit. With either a video or telephone visit, we are not always able to ensure that we have a secure connection.  By engaging in this virtual visit, you consent to the provision of healthcare and authorize for your insurance to be billed (if applicable) for the services provided during this visit. Depending on your insurance coverage, you may receive a charge related to this service.  I need to obtain your verbal consent now. Are you willing to proceed with your visit today? Alexandra Flynn has provided verbal consent on 08/03/2023 for a virtual visit (video or telephone). Lamar Schlossman, PA-C  Date: 08/03/2023 11:20 AM   Virtual Visit via Video Note   I, Lamar Schlossman, connected with  Alexandra Flynn  ([id number], September 22, 1970) on 08/03/23 at 11:15 AM EDT by a video-enabled telemedicine application and verified that I am speaking with the correct person using two identifiers.  Location: Patient: Virtual Visit  Location Patient: Home Provider: Virtual Visit Location Provider: Home Office   I discussed the limitations of evaluation and management by telemedicine and the availability of in person appointments. The patient expressed understanding and agreed to proceed.    History of Present Illness: Alexandra Flynn is a 53 y.o. who identifies as a female who was assigned female at birth, and is being seen today for motion sickness or vertigo.  States that she was on a cruise 2 weeks ago for a week.  States that since she got home from the cruise, she has been having dizziness and feeling like she has vertigo.  States that today she has had nausea.  States that she feels worse when she is walking. SABRA  HPI: HPI  Problems:  Patient Active Problem List   Diagnosis Date Noted   Hyperglycemia 09/14/2021   Tendonitis, Achilles, left 07/04/2019   Insomnia 11/28/2018   Plantar fasciitis 11/28/2018   Dyslipidemia 11/28/2018    Allergies: No Known Allergies Medications:  Current Outpatient Medications:    meclizine (ANTIVERT) 25 MG tablet, Take 1 tablet (25 mg total) by mouth 3 (three) times daily as needed for dizziness., Disp: 30 tablet, Rfl: 0   amitriptyline  (ELAVIL ) 25 MG tablet, Take 1 tablet (25 mg total) by mouth at bedtime., Disp: 90 tablet, Rfl: 3   COVID-19 At Home Antigen Test (CARESTART COVID-19 HOME TEST) KIT, USE AS DIRECTED, Disp: 4 each, Rfl: 0   Multiple Vitamin (MULTI-VITAMIN DAILY PO), Multi Vitamin, Disp: , Rfl:  scopolamine  (TRANSDERM-SCOP) 1 MG/3DAYS, Place 1 patch (1.5 mg total) onto the skin every 3 (three) days., Disp: 10 patch, Rfl: 0   solifenacin  (VESICARE ) 10 MG tablet, Take 1 tablet (10 mg total) by mouth daily., Disp: 90 tablet, Rfl: 4  Observations/Objective: Patient is well-developed, well-nourished in no acute distress.  Resting comfortably at home.  Head is normocephalic, atraumatic.  No labored breathing.  Speech is clear and coherent with logical content.   Patient is alert and oriented at baseline.    Assessment and Plan: 1. Dizziness (Primary)  Patient presenting with dizziness following being on a cruise.  She states that she feels slightly dizzy and has some nausea.  She is concerned about motion sickness or vertigo.  She has tried a dose of Dramamine a few days ago and has tried some decongestants without much relief.  Symptoms have been ongoing for about the past 5 days.  She does not have any horizontal nystagmus on my exam.  She does not have any other symptoms.  She has been able to work.  Will trial some meclizine.  Discussed that she may need additional follow-up if not improving.  Patient understands and agrees with plan.  Given her recent cruise, and predisposition for motion sickness, doubt central etiology of dizziness.  Follow Up Instructions: I discussed the assessment and treatment plan with the patient. The patient was provided an opportunity to ask questions and all were answered. The patient agreed with the plan and demonstrated an understanding of the instructions.  A copy of instructions were sent to the patient via MyChart unless otherwise noted below.     The patient was advised to call back or seek an in-person evaluation if the symptoms worsen or if the condition fails to improve as anticipated.    Lamar Schlossman, PA-C

## 2023-08-06 ENCOUNTER — Encounter: Payer: Self-pay | Admitting: Family Medicine

## 2023-08-06 NOTE — Telephone Encounter (Signed)
 Please schedule an office visit.

## 2023-08-15 ENCOUNTER — Ambulatory Visit

## 2023-08-15 ENCOUNTER — Encounter: Payer: Self-pay | Admitting: Family Medicine

## 2023-08-15 ENCOUNTER — Ambulatory Visit: Admitting: Family Medicine

## 2023-08-15 VITALS — BP 112/70 | HR 78 | Temp 97.7°F | Ht 63.0 in | Wt 174.2 lb

## 2023-08-15 DIAGNOSIS — G4700 Insomnia, unspecified: Secondary | ICD-10-CM

## 2023-08-15 DIAGNOSIS — M79644 Pain in right finger(s): Secondary | ICD-10-CM

## 2023-08-15 DIAGNOSIS — E785 Hyperlipidemia, unspecified: Secondary | ICD-10-CM | POA: Diagnosis not present

## 2023-08-15 DIAGNOSIS — R739 Hyperglycemia, unspecified: Secondary | ICD-10-CM

## 2023-08-15 LAB — HEMOGLOBIN A1C: Hgb A1c MFr Bld: 6.1 % (ref 4.6–6.5)

## 2023-08-15 LAB — COMPREHENSIVE METABOLIC PANEL WITH GFR
ALT: 17 U/L (ref 0–35)
AST: 38 U/L — ABNORMAL HIGH (ref 0–37)
Albumin: 4.3 g/dL (ref 3.5–5.2)
Alkaline Phosphatase: 53 U/L (ref 39–117)
BUN: 17 mg/dL (ref 6–23)
CO2: 28 meq/L (ref 19–32)
Calcium: 9.5 mg/dL (ref 8.4–10.5)
Chloride: 104 meq/L (ref 96–112)
Creatinine, Ser: 1.02 mg/dL (ref 0.40–1.20)
GFR: 63.62 mL/min (ref >60.00)
Glucose, Bld: 106 mg/dL — ABNORMAL HIGH (ref 70–99)
Potassium: 4 meq/L (ref 3.5–5.1)
Sodium: 139 meq/L (ref 135–145)
Total Bilirubin: 0.3 mg/dL (ref 0.2–1.2)
Total Protein: 7.5 g/dL (ref 6.0–8.3)

## 2023-08-15 LAB — LIPID PANEL
Cholesterol: 285 mg/dL — ABNORMAL HIGH (ref 0–200)
HDL: 57.2 mg/dL (ref >39.00)
LDL Cholesterol: 196 mg/dL — ABNORMAL HIGH (ref 0–99)
NonHDL: 228.01
Total CHOL/HDL Ratio: 5
Triglycerides: 159 mg/dL — ABNORMAL HIGH (ref 0.0–149.0)
VLDL: 31.8 mg/dL (ref 0.0–40.0)

## 2023-08-15 LAB — CBC
HCT: 41.6 % (ref 36.0–46.0)
Hemoglobin: 14 g/dL (ref 12.0–15.0)
MCHC: 33.5 g/dL (ref 30.0–36.0)
MCV: 89.8 fl (ref 78.0–100.0)
Platelets: 283 K/uL (ref 150.0–400.0)
RBC: 4.64 Mil/uL (ref 3.87–5.11)
RDW: 13.3 % (ref 11.5–15.5)
WBC: 5.8 K/uL (ref 4.0–10.5)

## 2023-08-15 LAB — TSH: TSH: 1.18 u[IU]/mL (ref 0.35–5.50)

## 2023-08-15 NOTE — Progress Notes (Signed)
 Alexandra Flynn is a 53 y.o. female who presents today for an office visit.  Assessment/Plan:  New/Acute Problems: Dizziness  Overall very reassuring exam.  No abnormalities on her neurologic exam.  Dix-Hallpike is negative and history is not consistent with classic vertigo-do not think that she would benefit from seeing a vestibular rehab specialist at this point.  It seems like she is mostly having motion sickness type symptoms.  Given persistence of symptoms over the last couple of weeks we did discuss obtaining MRI however patient would like to hold off on this for now.  Given that her symptoms are improving and the fact that she has a reassuring neurologic exam today I believe this is reasonable however she will let us  know if she has any worsening symptoms or if symptoms do not continue to improve over the next 1 to 2 weeks.  We will check labs today.  We discussed reasons to return to care  Right middle finger pain No red flags.  She does have some joint laxity and likely has some developing early osteoarthritis.  Given the symptoms have been persistent for the last couple of months we will check x-ray today to further evaluate.  It is okay for her to use over-the-counter meds as needed.  She can also discuss with sports medicine at their next visit.  Chronic Problems Addressed Today: Insomnia Stable on amitriptyline  25 mg nightly.  Hyperglycemia Check A1c.  Dyslipidemia Check lipids.  May consider starting statin depending on results.     Subjective:  HPI:  See assessment / plan for status of chronic conditions.   Discussed the use of AI scribe software for clinical note transcription with the patient, who gave verbal consent to proceed.  History of Present Illness Alexandra Flynn is a 52 year old female who presents with dizziness and hand pain.  She has been experiencing dizziness since July 20th, following a cruise. The dizziness is described as a 'rocking'  sensation, akin to still being on a boat, rather than room-spinning vertigo. It occurs randomly, often when walking long distances or looking down, but is not consistently triggered by head movements. The frequency of dizziness is decreasing over time. She experienced nausea on July 25th but no vomiting. She has a history of motion sickness, which she feels has worsened with age. She obtained scopolamine  for the cruise but did not use it as she was fine on the boat. Meclizine  was tried after the dizziness began, but it did not provide relief. No vision changes, palpitations, chest pain, shortness of breath, sweating, fevers, or chills. She experiences frequent headaches, which may have increased slightly in frequency recently. No numbness or tingling.  No vision changes.  She has been experiencing pain in her right middle finger for the past couple of months. The pain is noted when gripping objects like a steering wheel and when stretching the finger. She has not tried any treatments for it. She has a family history of rheumatoid arthritis, as her mother had the condition around her age.  She mentions a history of high cholesterol, with levels around 300 when donating blood, but her blood pressure is good. She is aware of her weight being a concern.         Objective:  Physical Exam: BP 112/70   Pulse 78   Temp 97.7 F (36.5 C) (Temporal)   Ht 5' 3 (1.6 m)   Wt 174 lb 3.2 oz (79 kg)   SpO2 98%  BMI 30.86 kg/m   Gen: No acute distress, resting comfortably CV: Regular rate and rhythm with no murmurs appreciated Pulm: Normal work of breathing, clear to auscultation bilaterally with no crackles, wheezes, or rhonchi Neuro: Cranial nerves II through XII intact.  Finger-nose-finger testing intact bilaterally.  Strength 5 out of 5 in upper and lower extremities.  Sensation light touch intact throughout.  Dix-Hallpike negative bilaterally. MUSCULOSKELETAL: No obvious deformities.  Joint laxity  noted at hands bilaterally.  Right third PIP joint nontender to palpation.  No pain with axial joint loading. Psych: Normal affect and thought content      Alexandra Bourn M. Kennyth, MD 08/15/2023 8:43 AM

## 2023-08-15 NOTE — Assessment & Plan Note (Signed)
 Check A1c.

## 2023-08-15 NOTE — Assessment & Plan Note (Signed)
 Check lipids.  May consider starting statin depending on results.

## 2023-08-15 NOTE — Patient Instructions (Signed)
 It was very nice to see you today!  VISIT SUMMARY: During your visit, we discussed your dizziness, hand pain, headaches, high cholesterol, and prediabetes. We have outlined a plan to address each of these issues and will monitor your progress closely.  YOUR PLAN: MOTION SICKNESS WITH DIZZINESS: You have been experiencing a rocking sensation of dizziness since July 20th. -Monitor your symptoms for a few more weeks. If they do not improve or worsen, we may consider an MRI.  RIGHT MIDDLE FINGER PAIN, LIKELY OSTEOARTHRITIS: You have been experiencing pain in your right middle finger for the past couple of months, likely due to osteoarthritis. -We will order an X-ray of your right middle finger. -Consider taking ibuprofen or Tylenol for pain relief. -You can also use Voltaren  gel for topical relief. -We discussed taping the finger for support.  HEADACHE: Your chronic headaches have slightly increased in frequency, likely related to motion sickness. -Monitor your headache frequency and let us  know if they worsen.  HYPERCHOLESTEROLEMIA: Your cholesterol levels are high, around 300, with good blood pressure but a weight concern. -We will order blood work to check your cholesterol levels, A1c, blood counts, and TSH. -We will consider starting you on a statin after reviewing your blood work results.  PREDIABETES: We need to assess your prediabetes status with updated blood work. -We will order blood work including A1c to assess your prediabetes status.  Return in about 1 year (around 08/14/2024), or if symptoms worsen or fail to improve, for Annual Physical.   Take care, Dr Kennyth  PLEASE NOTE:  If you had any lab tests, please let us  know if you have not heard back within a few days. You may see your results on mychart before we have a chance to review them but we will give you a call once they are reviewed by us .   If we ordered any referrals today, please let us  know if you have not heard  from their office within the next week.   If you had any urgent prescriptions sent in today, please check with the pharmacy within an hour of our visit to make sure the prescription was transmitted appropriately.   Please try these tips to maintain a healthy lifestyle:  Eat at least 3 REAL meals and 1-2 snacks per day.  Aim for no more than 5 hours between eating.  If you eat breakfast, please do so within one hour of getting up.   Each meal should contain half fruits/vegetables, one quarter protein, and one quarter carbs (no bigger than a computer mouse)  Cut down on sweet beverages. This includes juice, soda, and sweet tea.   Drink at least 1 glass of water with each meal and aim for at least 8 glasses per day  Exercise at least 150 minutes every week.

## 2023-08-15 NOTE — Assessment & Plan Note (Signed)
 Stable on amitriptyline 25 mg nightly.

## 2023-08-16 ENCOUNTER — Ambulatory Visit: Payer: Self-pay | Admitting: Family Medicine

## 2023-08-16 NOTE — Progress Notes (Signed)
 Her cholesterol is very elevated. As we discussed at her visit she would benefit from starting a statin to improve her numbers and lower risk of heart attack and stroke. Please send in prescription for Lipitor 40 mg daily. We should recheck in 3-6 months.   Her Hemoglobin A1c is in the borderline range. Do not need to start medications for this however she should continue to work on diet and exercise and we can recheck this in a year or so.   All of her other labs are stable.

## 2023-08-17 ENCOUNTER — Other Ambulatory Visit (HOSPITAL_COMMUNITY): Payer: Self-pay

## 2023-08-17 ENCOUNTER — Other Ambulatory Visit: Payer: Self-pay | Admitting: *Deleted

## 2023-08-17 MED ORDER — ATORVASTATIN CALCIUM 40 MG PO TABS
40.0000 mg | ORAL_TABLET | Freq: Every day | ORAL | 1 refills | Status: DC
  Filled 2023-08-17: qty 90, 90d supply, fill #0
  Filled 2023-11-14: qty 90, 90d supply, fill #1

## 2023-08-17 NOTE — Telephone Encounter (Signed)
 Rx send to pharmacy

## 2023-08-28 NOTE — Progress Notes (Signed)
 Her x-ray of her finger was normal.  No signs of fracture.  She should let us  know if pain is not improving.

## 2023-10-31 ENCOUNTER — Other Ambulatory Visit (HOSPITAL_COMMUNITY): Payer: Self-pay

## 2023-10-31 DIAGNOSIS — B9689 Other specified bacterial agents as the cause of diseases classified elsewhere: Secondary | ICD-10-CM | POA: Diagnosis not present

## 2023-10-31 DIAGNOSIS — N760 Acute vaginitis: Secondary | ICD-10-CM | POA: Diagnosis not present

## 2023-10-31 MED ORDER — METRONIDAZOLE 500 MG PO TABS
500.0000 mg | ORAL_TABLET | Freq: Two times a day (BID) | ORAL | 0 refills | Status: AC
  Filled 2023-10-31: qty 14, 7d supply, fill #0

## 2024-02-06 ENCOUNTER — Other Ambulatory Visit: Payer: Self-pay | Admitting: Family Medicine

## 2024-02-06 ENCOUNTER — Other Ambulatory Visit (HOSPITAL_COMMUNITY): Payer: Self-pay

## 2024-02-06 ENCOUNTER — Other Ambulatory Visit: Payer: Self-pay

## 2024-02-06 MED ORDER — ATORVASTATIN CALCIUM 40 MG PO TABS
40.0000 mg | ORAL_TABLET | Freq: Every day | ORAL | 1 refills | Status: AC
  Filled 2024-02-06: qty 90, 90d supply, fill #0
